# Patient Record
Sex: Male | Born: 1945 | Race: White | Hispanic: No | Marital: Married | Smoking: Former smoker
Health system: Southern US, Community
[De-identification: ages and names within clinical notes are randomized; demographics above are authoritative.]

## PROBLEM LIST (undated history)

## (undated) DIAGNOSIS — Z8719 Personal history of other diseases of the digestive system: Secondary | ICD-10-CM

## (undated) DIAGNOSIS — S42309A Unspecified fracture of shaft of humerus, unspecified arm, initial encounter for closed fracture: Secondary | ICD-10-CM

## (undated) DIAGNOSIS — K219 Gastro-esophageal reflux disease without esophagitis: Secondary | ICD-10-CM

## (undated) DIAGNOSIS — E7800 Pure hypercholesterolemia, unspecified: Secondary | ICD-10-CM

## (undated) DIAGNOSIS — I10 Essential (primary) hypertension: Secondary | ICD-10-CM

## (undated) DIAGNOSIS — K635 Polyp of colon: Secondary | ICD-10-CM

## (undated) DIAGNOSIS — T4145XA Adverse effect of unspecified anesthetic, initial encounter: Secondary | ICD-10-CM

## (undated) DIAGNOSIS — M1990 Unspecified osteoarthritis, unspecified site: Secondary | ICD-10-CM

## (undated) DIAGNOSIS — T8859XA Other complications of anesthesia, initial encounter: Secondary | ICD-10-CM

## (undated) DIAGNOSIS — E669 Obesity, unspecified: Secondary | ICD-10-CM

## (undated) HISTORY — PX: OTHER SURGICAL HISTORY: SHX169

## (undated) HISTORY — DX: Obesity, unspecified: E66.9

## (undated) HISTORY — DX: Unspecified fracture of shaft of humerus, unspecified arm, initial encounter for closed fracture: S42.309A

## (undated) HISTORY — DX: Pure hypercholesterolemia, unspecified: E78.00

## (undated) HISTORY — DX: Polyp of colon: K63.5

## (undated) HISTORY — DX: Essential (primary) hypertension: I10

## (undated) HISTORY — PX: BACK SURGERY: SHX140

---

## 1998-05-28 HISTORY — PX: LUMBAR FUSION: SHX111

## 1999-06-17 ENCOUNTER — Encounter: Payer: Self-pay | Admitting: Family Medicine

## 1999-06-17 ENCOUNTER — Ambulatory Visit (HOSPITAL_COMMUNITY): Admission: RE | Admit: 1999-06-17 | Discharge: 1999-06-17 | Payer: Self-pay | Admitting: Family Medicine

## 1999-07-05 ENCOUNTER — Ambulatory Visit (HOSPITAL_COMMUNITY): Admission: RE | Admit: 1999-07-05 | Discharge: 1999-07-05 | Payer: Self-pay | Admitting: Family Medicine

## 1999-07-05 ENCOUNTER — Encounter: Payer: Self-pay | Admitting: Family Medicine

## 1999-07-06 ENCOUNTER — Encounter: Payer: Self-pay | Admitting: Family Medicine

## 1999-07-06 ENCOUNTER — Ambulatory Visit (HOSPITAL_COMMUNITY): Admission: RE | Admit: 1999-07-06 | Discharge: 1999-07-06 | Payer: Self-pay | Admitting: Family Medicine

## 1999-10-03 ENCOUNTER — Encounter: Payer: Self-pay | Admitting: Gynecology

## 1999-10-03 ENCOUNTER — Ambulatory Visit (HOSPITAL_COMMUNITY): Admission: RE | Admit: 1999-10-03 | Discharge: 1999-10-03 | Payer: Self-pay | Admitting: Gynecology

## 1999-10-03 ENCOUNTER — Encounter: Payer: Self-pay | Admitting: Neurosurgery

## 1999-10-05 ENCOUNTER — Encounter: Payer: Self-pay | Admitting: Neurosurgery

## 1999-10-05 ENCOUNTER — Inpatient Hospital Stay (HOSPITAL_COMMUNITY): Admission: RE | Admit: 1999-10-05 | Discharge: 1999-10-07 | Payer: Self-pay | Admitting: Neurosurgery

## 1999-10-08 ENCOUNTER — Inpatient Hospital Stay (HOSPITAL_COMMUNITY): Admission: AD | Admit: 1999-10-08 | Discharge: 1999-10-10 | Payer: Self-pay | Admitting: Neurosurgery

## 2001-05-13 ENCOUNTER — Emergency Department (HOSPITAL_COMMUNITY): Admission: EM | Admit: 2001-05-13 | Discharge: 2001-05-13 | Payer: Self-pay | Admitting: Emergency Medicine

## 2001-05-14 ENCOUNTER — Encounter: Payer: Self-pay | Admitting: Emergency Medicine

## 2001-05-23 ENCOUNTER — Ambulatory Visit (HOSPITAL_COMMUNITY): Admission: RE | Admit: 2001-05-23 | Discharge: 2001-05-23 | Payer: Self-pay | Admitting: Family Medicine

## 2001-05-23 ENCOUNTER — Encounter: Payer: Self-pay | Admitting: Family Medicine

## 2002-12-22 ENCOUNTER — Encounter: Payer: Self-pay | Admitting: Family Medicine

## 2002-12-22 ENCOUNTER — Ambulatory Visit (HOSPITAL_COMMUNITY): Admission: RE | Admit: 2002-12-22 | Discharge: 2002-12-22 | Payer: Self-pay | Admitting: Family Medicine

## 2003-04-19 ENCOUNTER — Ambulatory Visit (HOSPITAL_BASED_OUTPATIENT_CLINIC_OR_DEPARTMENT_OTHER): Admission: RE | Admit: 2003-04-19 | Discharge: 2003-04-19 | Payer: Self-pay | Admitting: Orthopedic Surgery

## 2003-04-19 ENCOUNTER — Ambulatory Visit (HOSPITAL_COMMUNITY): Admission: RE | Admit: 2003-04-19 | Discharge: 2003-04-19 | Payer: Self-pay | Admitting: Orthopedic Surgery

## 2003-12-21 ENCOUNTER — Ambulatory Visit (HOSPITAL_COMMUNITY): Admission: RE | Admit: 2003-12-21 | Discharge: 2003-12-21 | Payer: Self-pay | Admitting: Family Medicine

## 2004-02-14 ENCOUNTER — Ambulatory Visit (HOSPITAL_BASED_OUTPATIENT_CLINIC_OR_DEPARTMENT_OTHER): Admission: RE | Admit: 2004-02-14 | Discharge: 2004-02-14 | Payer: Self-pay | Admitting: Plastic Surgery

## 2004-02-14 ENCOUNTER — Encounter (INDEPENDENT_AMBULATORY_CARE_PROVIDER_SITE_OTHER): Payer: Self-pay | Admitting: Specialist

## 2004-02-14 ENCOUNTER — Ambulatory Visit (HOSPITAL_COMMUNITY): Admission: RE | Admit: 2004-02-14 | Discharge: 2004-02-14 | Payer: Self-pay | Admitting: Plastic Surgery

## 2004-06-12 ENCOUNTER — Ambulatory Visit: Payer: Self-pay | Admitting: Internal Medicine

## 2004-06-12 ENCOUNTER — Ambulatory Visit (HOSPITAL_BASED_OUTPATIENT_CLINIC_OR_DEPARTMENT_OTHER): Admission: RE | Admit: 2004-06-12 | Discharge: 2004-06-12 | Payer: Self-pay | Admitting: Family Medicine

## 2007-07-17 ENCOUNTER — Ambulatory Visit (HOSPITAL_COMMUNITY): Admission: RE | Admit: 2007-07-17 | Discharge: 2007-07-17 | Payer: Self-pay | Admitting: General Surgery

## 2007-07-29 ENCOUNTER — Encounter: Admission: RE | Admit: 2007-07-29 | Discharge: 2007-07-29 | Payer: Self-pay | Admitting: General Surgery

## 2007-11-10 ENCOUNTER — Encounter: Admission: RE | Admit: 2007-11-10 | Discharge: 2008-02-08 | Payer: Self-pay | Admitting: General Surgery

## 2007-11-25 ENCOUNTER — Ambulatory Visit (HOSPITAL_COMMUNITY): Admission: RE | Admit: 2007-11-25 | Discharge: 2007-11-26 | Payer: Self-pay | Admitting: General Surgery

## 2007-11-25 HISTORY — PX: LAPAROSCOPIC GASTRIC BANDING: SHX1100

## 2008-05-28 HISTORY — PX: TOTAL KNEE ARTHROPLASTY: SHX125

## 2009-02-18 ENCOUNTER — Ambulatory Visit (HOSPITAL_COMMUNITY): Admission: RE | Admit: 2009-02-18 | Discharge: 2009-02-18 | Payer: Self-pay | Admitting: Neurological Surgery

## 2009-06-20 ENCOUNTER — Inpatient Hospital Stay (HOSPITAL_COMMUNITY): Admission: RE | Admit: 2009-06-20 | Discharge: 2009-06-23 | Payer: Self-pay | Admitting: Orthopedic Surgery

## 2009-11-07 ENCOUNTER — Inpatient Hospital Stay (HOSPITAL_COMMUNITY): Admission: RE | Admit: 2009-11-07 | Discharge: 2009-11-09 | Payer: Self-pay | Admitting: Orthopedic Surgery

## 2010-08-13 LAB — BASIC METABOLIC PANEL
BUN: 8 mg/dL (ref 6–23)
CO2: 29 mEq/L (ref 19–32)
CO2: 30 mEq/L (ref 19–32)
CO2: 30 mEq/L (ref 19–32)
Chloride: 100 mEq/L (ref 96–112)
Chloride: 104 mEq/L (ref 96–112)
Chloride: 104 mEq/L (ref 96–112)
Chloride: 103 mEq/L (ref 96–112)
Creatinine, Ser: 0.67 mg/dL (ref 0.4–1.5)
GFR calc Af Amer: >60 mL/min (ref >60)
GFR calc Af Amer: >60 mL/min (ref >60)
GFR calc non Af Amer: >60 mL/min (ref >60)
Glucose, Bld: 146 mg/dL — ABNORMAL HIGH (ref 70–99)
Glucose, Bld: 149 mg/dL — ABNORMAL HIGH (ref 70–99)
Glucose, Bld: 116 mg/dL — ABNORMAL HIGH (ref 70–99)
Potassium: 3.7 mEq/L (ref 3.5–5.1)
Potassium: 3.9 mEq/L (ref 3.5–5.1)
Potassium: 3.7 mEq/L (ref 3.5–5.1)
Sodium: 139 mEq/L (ref 135–145)

## 2010-08-13 LAB — CBC
HCT: 26.9 % — ABNORMAL LOW (ref 39.0–52.0)
HCT: 30.9 % — ABNORMAL LOW (ref 39.0–52.0)
HCT: 27 % — ABNORMAL LOW (ref 39.0–52.0)
Hemoglobin: 10.5 g/dL — ABNORMAL LOW (ref 13.0–17.0)
Hemoglobin: 13.9 g/dL (ref 13.0–17.0)
Hemoglobin: 9.1 g/dL — ABNORMAL LOW (ref 13.0–17.0)
MCHC: 33.7 g/dL (ref 30.0–36.0)
MCHC: 33.9 g/dL (ref 30.0–36.0)
MCHC: 34.6 g/dL (ref 30.0–36.0)
MCHC: 34.8 g/dL (ref 30.0–36.0)
MCV: 92.4 fL (ref 78.0–100.0)
MCV: 92.4 fL (ref 78.0–100.0)
MCV: 92.5 fL (ref 78.0–100.0)
MCV: 87.6 fL (ref 78.0–100.0)
Platelets: 228 10*3/uL (ref 150–400)
Platelets: 172 10*3/uL (ref 150–400)
RBC: 2.57 MIL/uL — ABNORMAL LOW (ref 4.22–5.81)
RBC: 3.34 MIL/uL — ABNORMAL LOW (ref 4.22–5.81)
RDW: 13.3 % (ref 11.5–15.5)
RDW: 13.3 % (ref 11.5–15.5)
RDW: 13.4 % (ref 11.5–15.5)
RDW: 16.2 % — ABNORMAL HIGH (ref 11.5–15.5)
WBC: 11.2 10*3/uL — ABNORMAL HIGH (ref 4.0–10.5)

## 2010-08-13 LAB — COMPREHENSIVE METABOLIC PANEL
ALT: 31 U/L (ref 0–53)
AST: 30 U/L (ref 0–37)
Albumin: 4.6 g/dL (ref 3.5–5.2)
Alkaline Phosphatase: 53 U/L (ref 39–117)
Calcium: 10.1 mg/dL (ref 8.4–10.5)
GFR calc Af Amer: >60 mL/min (ref >60)
Potassium: 4.2 mEq/L (ref 3.5–5.1)
Sodium: 138 mEq/L (ref 135–145)
Total Protein: 7.2 g/dL (ref 6.0–8.3)

## 2010-08-13 LAB — APTT: aPTT: 27 seconds (ref 24–37)

## 2010-08-13 LAB — DIFFERENTIAL
Basophils Relative: 1 % (ref 0–1)
Eosinophils Absolute: 0.2 10*3/uL (ref 0.0–0.7)
Lymphs Abs: 2.1 10*3/uL (ref 0.7–4.0)
Monocytes Absolute: 0.7 10*3/uL (ref 0.1–1.0)
Monocytes Relative: 9 % (ref 3–12)
Neutrophils Relative %: 61 % (ref 43–77)

## 2010-08-13 LAB — URINE CULTURE: Colony Count: NO GROWTH

## 2010-08-13 LAB — URINALYSIS, ROUTINE W REFLEX MICROSCOPIC
Bilirubin Urine: NEGATIVE
Hgb urine dipstick: NEGATIVE
Nitrite: NEGATIVE
Specific Gravity, Urine: 1.023 (ref 1.005–1.030)
Urobilinogen, UA: 1 mg/dL (ref 0.0–1.0)
pH: 6.5 (ref 5.0–8.0)

## 2010-08-13 LAB — ABO/RH: ABO/RH(D): O POS

## 2010-08-13 LAB — PROTIME-INR
INR: 1.11 (ref 0.00–1.49)
Prothrombin Time: 29.7 seconds — ABNORMAL HIGH (ref 11.6–15.2)

## 2010-08-14 LAB — URINE CULTURE
Colony Count: NO GROWTH
Culture: NO GROWTH

## 2010-08-14 LAB — COMPREHENSIVE METABOLIC PANEL
ALT: 21 U/L (ref 0–53)
Albumin: 4.1 g/dL (ref 3.5–5.2)
Alkaline Phosphatase: 77 U/L (ref 39–117)
GFR calc Af Amer: >60 mL/min (ref >60)
Potassium: 4.2 mEq/L (ref 3.5–5.1)
Sodium: 140 mEq/L (ref 135–145)
Total Protein: 6.9 g/dL (ref 6.0–8.3)

## 2010-08-14 LAB — BASIC METABOLIC PANEL
BUN: 10 mg/dL (ref 6–23)
CO2: 32 mEq/L (ref 19–32)
Calcium: 9 mg/dL (ref 8.4–10.5)
Chloride: 104 mEq/L (ref 96–112)
Creatinine, Ser: 0.66 mg/dL (ref 0.4–1.5)
Glucose, Bld: 115 mg/dL — ABNORMAL HIGH (ref 70–99)

## 2010-08-14 LAB — URINALYSIS, ROUTINE W REFLEX MICROSCOPIC
Glucose, UA: NEGATIVE mg/dL
Hgb urine dipstick: NEGATIVE
Specific Gravity, Urine: 1.027 (ref 1.005–1.030)
Urobilinogen, UA: 0.2 mg/dL (ref 0.0–1.0)
pH: 5 (ref 5.0–8.0)

## 2010-08-14 LAB — CBC
MCHC: 33.9 g/dL (ref 30.0–36.0)
MCV: 87.3 fL (ref 78.0–100.0)
Platelets: 270 10*3/uL (ref 150–400)
RBC: 3.34 MIL/uL — ABNORMAL LOW (ref 4.22–5.81)
RDW: 16.4 % — ABNORMAL HIGH (ref 11.5–15.5)
RDW: 16.4 % — ABNORMAL HIGH (ref 11.5–15.5)

## 2010-08-14 LAB — DIFFERENTIAL
Basophils Relative: 1 % (ref 0–1)
Eosinophils Absolute: 0.3 10*3/uL (ref 0.0–0.7)
Monocytes Absolute: 0.6 10*3/uL (ref 0.1–1.0)
Monocytes Relative: 8 % (ref 3–12)
Neutro Abs: 5.1 10*3/uL (ref 1.7–7.7)

## 2010-08-14 LAB — TYPE AND SCREEN: ABO/RH(D): O POS

## 2010-08-14 LAB — APTT: aPTT: 28 seconds (ref 24–37)

## 2010-09-01 LAB — BASIC METABOLIC PANEL
CO2: 30 mEq/L (ref 19–32)
Calcium: 9.4 mg/dL (ref 8.4–10.5)
Creatinine, Ser: 0.56 mg/dL (ref 0.4–1.5)
GFR calc Af Amer: >60 mL/min (ref >60)
GFR calc non Af Amer: >60 mL/min (ref >60)
Glucose, Bld: 102 mg/dL — ABNORMAL HIGH (ref 70–99)
Sodium: 138 mEq/L (ref 135–145)

## 2010-09-01 LAB — DIFFERENTIAL
Basophils Absolute: 0 10*3/uL (ref 0.0–0.1)
Basophils Relative: 0 % (ref 0–1)
Lymphocytes Relative: 17 % (ref 12–46)
Neutro Abs: 7.8 10*3/uL — ABNORMAL HIGH (ref 1.7–7.7)
Neutrophils Relative %: 73 % (ref 43–77)

## 2010-09-01 LAB — APTT: aPTT: 26 seconds (ref 24–37)

## 2010-09-01 LAB — CBC
Hemoglobin: 12.7 g/dL — ABNORMAL LOW (ref 13.0–17.0)
MCHC: 34 g/dL (ref 30.0–36.0)
RBC: 4.02 MIL/uL — ABNORMAL LOW (ref 4.22–5.81)
RDW: 14.1 % (ref 11.5–15.5)

## 2010-09-01 LAB — PROTIME-INR: INR: 1.1 (ref 0.00–1.49)

## 2010-10-10 NOTE — Op Note (Signed)
Philip [name], Philip [name]              ACCOUNT NO.:  [id number]   MEDICAL RECORD NO.:  [id number]          PATIENT TYPE:  OIB   LOCATION:  0098                         FACILITY:  [facility]   PHYSICIAN:  Sharlet Salina T. Hoxworth, M.D.DATE OF BIRTH:  Nov 03, 1945   DATE OF PROCEDURE:  11/25/2007  DATE OF DISCHARGE:                               OPERATIVE REPORT   PREOPERATIVE DIAGNOSIS:  Morbid obesity.   POSTOPERATIVE DIAGNOSIS:  Morbid obesity.   SURGICAL PROCEDURE:  Placement of laparoscopic adjustable gastric band.   SURGEON:  Lorne Skeens. Hoxworth, M.D.   ASSISTANT:  Thornton Park. Daphine Deutscher, MD   ANESTHESIA:  General.   BRIEF HISTORY:  Mr. Dobratz is a 65 year old male with longstanding  morbid obesity unresponsive to medical management with comorbidities of  degenerative disease, obstructive sleep apnea, hypertension, and  elevated cholesterol.  After extensive preoperative workup and  discussion detailed elsewhere, we have elected to proceed with the  placement laparoscopic adjustable gastric band for his obesity.  He was  brought to operating room for this procedure.   DESCRIPTION OF OPERATION:  The patient brought to operating room and  placed in the supine position on the operating table, and general  orotracheal anesthesia was induced.  The abdomen was widely sterilely  prepped and draped.  PAS were placed.  He received IV antibiotics and  subcutaneous heparin preoperatively.  Correct patient and procedure were  verified.  Local anesthesia was used to infiltrate the trocar sites.  Access was obtained in the left subcostal space with an 11-mm OptiVu  trocar without difficulty and pneumoperitoneum established.   Under direct vision a 15-mm trocar was placed laterally in the right  upper abdomen and 11-mm trocar in the right upper abdomen midclavicular  area, and another 11-mm trocar to the left of the midline and upper  abdomen for the camera port.  Through a 5-mm subxiphoid site, a  Nathanson's retractor was placed and the left lobe of liver elevated  with exposure of the upper stomach, gastrohepatic omentum, and hiatus.  The peritoneum overlying the left crus was incised with cautery leaving  some lateral attachments to the fundus, and careful blunt dissection was  carried back down along the left crus toward the retrogastric space.   The gastrohepatic omentum was then opened in an avascular area, and the  base of the right crus exposed.  At the area of crossing fat the  peritoneum was incised, and using the finger dissector with careful  blunt dissection the tip was passed retrogastric and then deployed back  up through the previously dissected area near the angle of His without  difficulty.  An 80 large flushed band system was introduced, the tubing  passed into the finger dissector which was then brought back up behind  the stomach, and the band brought back around behind the stomach without  difficulty.  With the sizing tube in place, the band was buckled without  undue tension, and the sizing tube removed.  Holding the band tubing  towards the patient's feet, the fundus of the stomach was imbricated up  over the band to the small  gastric pouch with 3 interrupted 2-0 Ethibond  sutures.  The sizing tube was removed without difficulty.  The band  appeared to be in good position.   The abdomen was inspected for hemostasis which appeared complete.  The  tubing was brought out through the right mid abdominal trocar site.  The  Nathanson's retractor was removed under direct vision.  All CO2  evacuated and trocars removed.  The tubing was cut and attached to the  port, and the port was sutured to the anterior fascia at the right mid  abdominal incision after lengthening it slightly with interrupted 2-0  Prolene sutures.  The tubing was seen to curve smoothly into the  abdomen.  The subcu at the site was then closed with running 2-0 Vicryl,  and skin incisions were  closed with subcuticular Monocryl and Dermabond.  Sponge and needle counts were correct.  The patient taken to recovery in  good condition.      Lorne Skeens. Hoxworth, M.D.  Electronically Signed     BTH/MEDQ  D:  11/25/2007  T:  11/25/2007  Job:  [id number]

## 2010-10-13 NOTE — Procedures (Signed)
NAMEANDRES, Philip [name]              ACCOUNT NO.:  [id number]   MEDICAL RECORD NO.:  [id number]          PATIENT TYPE:  OUT   LOCATION:  SLEEP CENTER                 FACILITY:  [facility]   PHYSICIAN:  Clinton D. Maple Hudson, M.D. DATE OF BIRTH:  01/09/1946   DATE OF STUDY:                              NOCTURNAL POLYSOMNOGRAM   REFERRING PHYSICIAN:  Dr. Nolene Ebbs, IV   INDICATION FOR STUDY:  Hypersomnia with sleep apnea.  Epworth sleepiness  score 12/24.  BMI 47.3.  Weight 320 pounds.   SLEEP ARCHITECTURE:  Total sleep time 398 minutes with sleep efficiency 91%.  Stage 1 was 9%, stage 2 48%, stages 3 and 4 17%, and REM was 26% of total  sleep time.  Sleep latency 16 minutes, REM latency 167 minutes, awake after  sleep onset 20 minutes, arousal index 33.   RESPIRATORY DATA:  Split study protocol.  RDI 80.7 per hour indicating very  severe obstructive sleep apnea/hypopnea syndrome before CPAP.  This included  36 obstructive apneas and 159 hypopneas before CPAP.  Almost all sleep and  therefore almost all the events were while supine.  REM RDI 16.  CPAP was  titrated to 12 CWP, RDI 3.6 per hour using a ResMed Mirage with medium nasal  pillows and a heated humidifier.   OXYGEN DATA:  Very loud snoring with oxygen desaturation to a nadir of 79%  before CPAP.  After CPAP control, oxygen saturation held 95-99%.   CARDIAC DATA:  Normal sinus rhythm.   MOVEMENT/PARASOMNIA:  A total of 260 limb jerks were recorded, of which 36  were associated with arousal or awakening for a periodic limb movement with  arousal index of 5.4 per hour which is increased.   IMPRESSION/RECOMMENDATION:  Severe obstructive sleep apnea/hypopnea  syndrome, respiratory disturbance index 80.7 per hour with oxygen  desaturation to 79%.  Successful CPAP titration to 12 CWP, respiratory  disturbance index 3.6 per hour using a ResMed Mirage with medium nasal  pillows and heated humidifier.  Periodic limb movement with  arousal, 5.4 per  hour.      CDY/MEDQ  D:  06/18/2004 11:11:04  T:  06/18/2004 15:16:06  Job:  [id number]

## 2010-10-13 NOTE — Discharge Summary (Signed)
[place]. [facility]  Patient:    YING, ROCKS                     MRN: [id number] Adm. Date:  [id number] Disc. Date: 10/10/99 Attending:  Jackelyn Knife                           Discharge Summary  HISTORY OF PRESENT ILLNESS:  The patient is a 65 year old man who had surgery on his back last week for spinal stenosis at L2-3 and for foraminal stenosis on the left at L5-S1.  He is generally fairly healthy, except for his size, which is 303 pounds.  He was discharged two days after his initial surgery and was home only overnight, but had to be readmitted because of drainage from his wound, as well as severe pain and muscle spasm.  HOSPITAL COURSE:  He was readmitted on Oct 08, 1999, and cultures were taken of the small amount of drainage at that particular time coming from his incision.  He was placed on IV antibiotics and muscle relaxers and analgesics and this helped his overall situation rather dramatically.  He was recultured and then on Oct 09, 1999, started on Cipro in anticipation of discharge today.  At the time of discharge, Oct 10, 1999, his drainage had become less, although it was still present.  Cultures were not revealing at the time of discharge. He was much more comfortable and was able to get about and move with greater ease and wanted to go home.  It was elected to discharge him and follow his incision status closely as an outpatient.  FINAL DIAGNOSES: 1. Status post surgery for spinal stenosis at L2-3 and foraminal stenosis,    left L5-S1. 2. Drainage from his wound, which is probably necrotic subcutaneous fatty    tissue. 3. Concern over potential wound infection.  CONDITION ON DISCHARGE:  Much improved.  He still had some drainage from his incision as noted, but it was much less and he was much more comfortable.  ACTIVITIES:  Up ad lib.  He is to walk several times a day for 15 minutes a session.  DIET:  A 1500 calorie  reducing diet.  DISCHARGE MEDICATIONS:  He has Percocet at home and I want him on Cipro 750 mg p.o. b.i.d.  FOLLOW-UP:  He is to come back and see me in the office in three days for check of his incision.  WOUND CARE:  He has instructions regarding wound care. DD:  10/10/99 TD:  10/10/99 Job: [id number]

## 2010-10-13 NOTE — H&P (Signed)
[place]. [facility]  Patient:    Philip [name], Philip [name]                     MRN: [id number] Adm. Date:  [id number] Disc. Date: [id number] Attending:  Jackelyn Knife                         History and Physical  HISTORY:  Philip [name] is a 65 year old man who was discharged one day ago after having had a bilateral decompression at L2-3 for spinal stenosis and also a left foraminotomy at L5-S1.  He was discharged home and remained in considerable pain at home.  His medications were adjusted and progress seem to be made in that regard, but a phone call this morning from his wife revealed that he was again quite uncomfortable and had what sounded like major drainage from his wound.  A decision was made at that point o go ahead and readmit him for evaluation of this drainage and also as a matter of comfort to be able to get him back on IV analgesics and muscle relaxants.  It should be noted that this man weighs 303.0 pounds.  Past medical history, family history, review of systems are unchanged from three days ago.  PHYSICAL EXAMINATION: On examination, he is alert and cooperative.  HEENT:  Normal.  CHEST:  Clear.  CARDIAC:  Negative.  ABDOMEN:  Obese, but soft and nontender.  BACK:  Examination of his back reveals drainage from the upper of the two incisions with a good deal of soft tissue bruising over the lumbosacral area generally. he dressing had just been changed and there was only a small amount of drainage, barely enough to culture, but this was done for culture and sensitivity.  The wound was redressed.  NEUROLOGIC:  His motor examination showed good strength, including dorsiflexor strength on the left, which had been a problem.  IMPRESSION: 1. Severe muscle spasm postoperatively requiring readmission for analgesia. 2. Drainage from his wound, which is probably fat necrosis given his size.  We ill    have to get him on IV  antibiotics.  PLAN:  Our plan is to bring him in for IV analgesics and IV antibiotics and a culture, which has already been done. DD:  10/08/99 TD:  10/08/99 Job: [id number]

## 2011-02-22 LAB — BASIC METABOLIC PANEL
BUN: 17
Calcium: 10
Creatinine, Ser: 0.68
GFR calc Af Amer: >60
GFR calc non Af Amer: >60

## 2011-02-22 LAB — CBC
MCHC: 34
Platelets: 252
RDW: 13.9

## 2011-02-22 LAB — DIFFERENTIAL
Basophils Absolute: 0
Basophils Relative: 0
Monocytes Absolute: 1.1 — ABNORMAL HIGH
Neutro Abs: 7.4
Neutrophils Relative %: 68

## 2011-02-22 LAB — HEMOGLOBIN AND HEMATOCRIT, BLOOD
HCT: 40.1
Hemoglobin: 13.7

## 2011-07-16 ENCOUNTER — Encounter: Payer: Self-pay | Admitting: Gastroenterology

## 2011-07-27 ENCOUNTER — Ambulatory Visit (AMBULATORY_SURGERY_CENTER): Payer: Medicare Other

## 2011-07-27 VITALS — Ht 68.0 in | Wt 268.0 lb

## 2011-07-27 DIAGNOSIS — Z1211 Encounter for screening for malignant neoplasm of colon: Secondary | ICD-10-CM

## 2011-07-27 MED ORDER — PEG-KCL-NACL-NASULF-NA ASC-C 100 G PO SOLR: 1.0000 | Freq: Once | ORAL | Status: DC

## 2011-08-10 ENCOUNTER — Ambulatory Visit (AMBULATORY_SURGERY_CENTER): Payer: Medicare Other | Admitting: Gastroenterology

## 2011-08-10 ENCOUNTER — Encounter: Payer: Self-pay | Admitting: Gastroenterology

## 2011-08-10 VITALS — BP 137/83 | HR 78 | Temp 97.8°F | Resp 20 | Ht 68.0 in | Wt 268.0 lb

## 2011-08-10 DIAGNOSIS — Z86010 Personal history of colonic polyps: Secondary | ICD-10-CM

## 2011-08-10 DIAGNOSIS — Z1211 Encounter for screening for malignant neoplasm of colon: Secondary | ICD-10-CM

## 2011-08-10 MED ORDER — SODIUM CHLORIDE 0.9 % IV SOLN: 500.0000 mL | INTRAVENOUS | Status: DC

## 2011-08-10 NOTE — Progress Notes (Signed)
Patient did not experience any of the following events: a burn prior to discharge; a fall within the facility; wrong site/side/patient/procedure/implant event; or a hospital transfer or hospital admission upon discharge from the facility. (G8907) Patient did not have preoperative order for IV antibiotic SSI prophylaxis. (G8918)  

## 2011-08-10 NOTE — Op Note (Signed)
[facility] [address] [place], [place]  [address]  COLONOSCOPY PROCEDURE REPORT  PATIENT:  [name], Philip  MR#:  [id number] BIRTHDATE:  1946/04/01, 65 yrs. old  GENDER:  male ENDOSCOPIST:  Vania Rea. Jarold Motto, MD, [facility] REF. BY: PROCEDURE DATE:  08/10/2011 PROCEDURE:  Surveillance Colonoscopy ASA CLASS:  Class II INDICATIONS:  history of pre-cancerous (adenomatous) colon polyps  MEDICATIONS:   propofol (Diprivan) 200 mg IV  DESCRIPTION OF PROCEDURE:   After the risks and benefits and of the procedure were explained, informed consent was obtained. Digital rectal exam was performed and revealed no abnormalities. The LB CF-H180AL [id number] endoscope was introduced through the anus and advanced to the cecum, which was identified by both the appendix and ileocecal valve.  The quality of the prep was excellent, using MoviPrep.  The instrument was then slowly withdrawn as the colon was fully examined. <<PROCEDUREIMAGES>>  FINDINGS:  No polyps or cancers were seen.  This was otherwise a normal examination of the colon.   Retroflexed views in the rectum revealed no abnormalities.    The scope was then withdrawn from the patient and the procedure completed.  COMPLICATIONS:  None ENDOSCOPIC IMPRESSION: 1) No polyps or cancers 2) Otherwise normal examination RECOMMENDATIONS: 1) Continue current colorectal screening recommendations for "routine risk" patients with a repeat colonoscopy in 10 years.  REPEAT EXAM:  No  ______________________________ Vania Rea. Jarold Motto, MD, [facility]  CC:  Johny Blamer MD  n. Rosalie Doctor:   Vania Rea. Davarious Tumbleson at 08/10/2011 01:56 PM  Latina Craver, [phone]

## 2011-08-10 NOTE — Patient Instructions (Signed)

## 2011-08-13 ENCOUNTER — Telehealth: Payer: Self-pay | Admitting: *Deleted

## 2011-08-13 NOTE — Telephone Encounter (Signed)
  Follow up Call-  Call back number 08/10/2011  Post procedure Call Back phone  # [phone]  Permission to leave phone message Yes     Patient questions:  Do you have a fever, pain , or abdominal swelling? no Pain Score  0 *  Have you tolerated food without any problems? yes  Have you been able to return to your normal activities? yes  Do you have any questions about your discharge instructions: Diet   no Medications  no Follow up visit  no  Do you have questions or concerns about your Care? no  Actions: * If pain score is 4 or above: No action needed, pain <4.

## 2011-09-20 ENCOUNTER — Encounter (INDEPENDENT_AMBULATORY_CARE_PROVIDER_SITE_OTHER): Payer: Self-pay

## 2011-09-20 ENCOUNTER — Ambulatory Visit (INDEPENDENT_AMBULATORY_CARE_PROVIDER_SITE_OTHER): Payer: Medicare Other | Admitting: Physician Assistant

## 2011-09-20 VITALS — BP 160/80 | Ht 68.0 in | Wt 272.8 lb

## 2011-09-20 DIAGNOSIS — Z4651 Encounter for fitting and adjustment of gastric lap band: Secondary | ICD-10-CM

## 2011-09-20 NOTE — Patient Instructions (Signed)
Take clear liquids tonight. Thin protein shakes are ok to start tomorrow morning. Slowly advance your diet thereafter. Call us if you have persistent vomiting or regurgitation, night cough or reflux symptoms. Return as scheduled or sooner if you notice no changes in hunger/portion sizes.  

## 2011-09-20 NOTE — Progress Notes (Signed)
  HISTORY: Philip [name] is a 66 y.o.male who received an ap-large lap-band in June 2009 by Dr. Johna Sheriff. He was last seen 14 months ago and unfortunately he has gained 22 lbs. He is now 70lbs under his pre op weight. He has no untoward symptoms but is frustrated with being able to eat too much and also some poor food choices.  We talked extensively about these issues. It's pretty clear that he needs an adjustment but he needs to address the food choice issue as well.  VITAL SIGNS: Filed Vitals:   09/20/11 1058  BP: 160/80    PHYSICAL EXAM: Physical exam reveals a very well-appearing 65 y.o.male in no apparent distress Neurologic: Awake, alert, oriented Psych: Bright affect, conversant Respiratory: Breathing even and unlabored. No stridor or wheezing Abdomen: Soft, nontender, nondistended to palpation. Incisions well-healed. No incisional hernias. Port easily palpated. Extremities: Atraumatic, good range of motion.  ASSESMENT: 66 y.o.  male  s/p AP-Large lap-band.   PLAN: The patient's port was accessed with a 20G Huber needle without difficulty. Clear fluid was aspirated and 0.5 mL saline was added to the port to give a total predicted volume of 13 mL. The patient was able to swallow water without difficulty following the procedure and was instructed to take clear liquids for the next 24-48 hours and advance slowly as tolerated.

## 2011-10-25 ENCOUNTER — Encounter (INDEPENDENT_AMBULATORY_CARE_PROVIDER_SITE_OTHER): Payer: Self-pay

## 2011-10-25 ENCOUNTER — Ambulatory Visit (INDEPENDENT_AMBULATORY_CARE_PROVIDER_SITE_OTHER): Payer: Medicare Other | Admitting: Physician Assistant

## 2011-10-25 NOTE — Patient Instructions (Signed)
Return in two months or sooner if needed. 

## 2011-10-25 NOTE — Progress Notes (Signed)
  HISTORY: Philip [name] is a 66 y.o.male who received an AP-Large lap-band in June 2009 by Dr. Johna Sheriff. He comes in with no persistent complaints of hunger. His portion sizes remain small. He says that he's caught himself boredom eating, particularly when he's in the house. He's not certain that he needs an adjustment today.  VITAL SIGNS: Filed Vitals:   10/25/11 0949  BP: 130/78  Pulse: 72  Temp: 98.2 F (36.8 C)  Resp: 12    PHYSICAL EXAM: Physical exam reveals a very well-appearing 65 y.o.male in no apparent distress Neurologic: Awake, alert, oriented Psych: Bright affect, conversant Respiratory: Breathing even and unlabored. No stridor or wheezing Extremities: Atraumatic, good range of motion. Skin: Warm, Dry, no rashes Musculoskeletal: Normal gait, Joints normal  ASSESMENT: 66 y.o.  male  s/p AP-Large lap-band.   PLAN: We deferred an adjustment today as he appears to be in the green zone. We discussed indications for upward adjustment and he voiced understanding. We'll have him back in two months or sooner if needed.

## 2012-01-10 ENCOUNTER — Ambulatory Visit (INDEPENDENT_AMBULATORY_CARE_PROVIDER_SITE_OTHER): Payer: Medicare Other | Admitting: Physician Assistant

## 2012-01-10 ENCOUNTER — Encounter (INDEPENDENT_AMBULATORY_CARE_PROVIDER_SITE_OTHER): Payer: Self-pay

## 2012-01-10 NOTE — Patient Instructions (Signed)
Return in three months or sooner if needed, especially if you have difficulty swallowing, persistent regurgitation, nighttime cough or reflux, increasing hunger, larger than expected portion sizes or weight gain.

## 2012-01-10 NOTE — Progress Notes (Signed)
  HISTORY: Philip [name] is a 66 y.o.male who received an AP-Large lap-band in June 2009 by Dr. Johna Sheriff. He was last seen in late May and has gained about 2 lbs. We had an extended discussion of his eating habits. On an average day, he rises at around 9 am, first meal is instant oatmeal/grits. Lunch comes at about 1 pm with a small meal. Dinner is usually at 7 or so. He doesn't snack between meals. One-two hours after dinner, however, he has the intense desire to snack. He says he usually is watching television at this point. 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 He wants the fix for weight loss but knows that an adjustment wouldn't do him much good as he's taking 45 minutes to finish a meal that satisfies him.  VITAL SIGNS: Filed Vitals:   01/10/12 0926  BP: 170/88  Pulse: 84  Resp: 18    PHYSICAL EXAM: Physical exam reveals a very well-appearing 65 y.o.male in no apparent distress Neurologic: Awake, alert, oriented Psych: Bright affect, conversant Respiratory: Breathing even and unlabored. No stridor or wheezing Extremities: Atraumatic, good range of motion. Skin: Warm, Dry, no rashes Musculoskeletal: Normal gait, Joints normal  ASSESMENT: 66 y.o.  male  s/p AP-Large lap-band.   PLAN: We talked at length about boredom eating and lifestyle modification as a means to reduce his snacking. I'm confident that this will help him lose weight. A fill today will most likely give him maladaptive eating and therefore weight gain. We'll have him back in three months to monitor his progress.

## 2012-04-10 ENCOUNTER — Encounter (INDEPENDENT_AMBULATORY_CARE_PROVIDER_SITE_OTHER): Payer: Medicare Other

## 2012-05-08 ENCOUNTER — Encounter (INDEPENDENT_AMBULATORY_CARE_PROVIDER_SITE_OTHER): Payer: Medicare Other

## 2013-01-15 ENCOUNTER — Encounter (INDEPENDENT_AMBULATORY_CARE_PROVIDER_SITE_OTHER): Payer: Self-pay

## 2013-01-15 ENCOUNTER — Ambulatory Visit (INDEPENDENT_AMBULATORY_CARE_PROVIDER_SITE_OTHER): Payer: Medicare Other | Admitting: Physician Assistant

## 2013-01-15 VITALS — BP 140/89 | HR 87 | Temp 98.1°F | Resp 14 | Ht 68.0 in | Wt 268.6 lb

## 2013-01-15 DIAGNOSIS — Z4651 Encounter for fitting and adjustment of gastric lap band: Secondary | ICD-10-CM

## 2013-01-15 NOTE — Patient Instructions (Signed)
Return in three months. Focus on good food choices as well as physical activity. Return sooner if you have an increase in hunger, portion sizes or weight. Return also for difficulty swallowing, night cough, reflux.   

## 2013-01-15 NOTE — Progress Notes (Signed)
  HISTORY: Philip [name] is a 67 y.o.male who received an AP-Large lap-band in June 2009 by Dr. Johna Sheriff. He comes in with 1 lb weight loss and complaints of solid food intolerance. He has problems with steak and pork chops as well as sandwiches and hamburgers. It sounds like bread is something he tries to eat with little success, as expected. He does have significant hunger about 2 hours after eating a small dinner. He would like some fluid removed so he can have a more substantial healthy dinner.  VITAL SIGNS: Filed Vitals:   01/15/13 1151  BP: 140/89  Pulse: 87  Temp: 98.1 F (36.7 C)  Resp: 14    PHYSICAL EXAM: Physical exam reveals a very well-appearing 66 y.o.male in no apparent distress Neurologic: Awake, alert, oriented Psych: Bright affect, conversant Respiratory: Breathing even and unlabored. No stridor or wheezing Abdomen: Soft, nontender, nondistended to palpation. Incisions well-healed. No incisional hernias. Port easily palpated. Extremities: Atraumatic, good range of motion.  ASSESMENT: 67 y.o.  male  s/p AP-Large lap-band.   PLAN: The patient's port was accessed with a 20G Huber needle without difficulty. Clear fluid was aspirated and 0.5 mL saline was removed from the port to give a total predicted volume of 12.5 mL. The patient was advised to concentrate on healthy food choices and to avoid slider foods high in fats and carbohydrates. We talked extensively about maladaptive eating and choosing high protein foods that have staying power. Avoiding carbohydrates will be key. He voiced understanding and agreement.

## 2013-02-27 ENCOUNTER — Other Ambulatory Visit: Payer: Self-pay | Admitting: Neurological Surgery

## 2013-03-27 ENCOUNTER — Encounter (HOSPITAL_COMMUNITY): Payer: Self-pay

## 2013-03-30 ENCOUNTER — Other Ambulatory Visit (HOSPITAL_COMMUNITY): Payer: Self-pay | Admitting: *Deleted

## 2013-03-30 NOTE — Pre-Procedure Instructions (Signed)
Philip [name]  03/30/2013   Your procedure is scheduled on:  Wednesday, April 08, 2013 at 09:30 AM.   Report to [facility] Entrance "A" at 7:30 AM.   Call this number if you have problems the morning of surgery: [phone]   Remember:   Do not eat food or drink liquids after midnight Tuesday, 04/07/13.   Take these medicines the morning of surgery with A SIP OF WATER: doxazosin (CARDURA), oxyCODONE-acetaminophen (PERCOCET/ROXICET) -if needed.  Stop Aspirin as of Wednesday, 04/01/13.    Do not wear jewelry.  Do not wear lotions, powders, or cologne. You may wear deodorant.             Men may shave face and neck.  Do not bring valuables to the hospital.  [facility] is not responsible for any belongings or valuables.               Contacts, dentures or bridgework may not be worn into surgery.  Leave suitcase in the car. After surgery it may be brought to your room.  For patients admitted to the hospital, discharge time is determined by your  treatment team.               Special Instructions: Shower using CHG 2 nights before surgery and the night before surgery.  If you shower the day of surgery use CHG.  Use special wash - you have one bottle of CHG for all showers.  You should use approximately 1/3 of the bottle for each shower.   Please read over the following fact sheets that you were given: Pain Booklet, Coughing and Deep Breathing, Blood Transfusion Information, MRSA Information and Surgical Site Infection Prevention

## 2013-03-31 ENCOUNTER — Encounter (HOSPITAL_COMMUNITY)
Admission: RE | Admit: 2013-03-31 | Discharge: 2013-03-31 | Disposition: A | Discharge status: Home / Self Care | Payer: Medicare Other | Source: Ambulatory Visit | Attending: Neurological Surgery | Admitting: Neurological Surgery

## 2013-03-31 ENCOUNTER — Encounter (HOSPITAL_COMMUNITY): Payer: Self-pay

## 2013-03-31 DIAGNOSIS — Z01812 Encounter for preprocedural laboratory examination: Secondary | ICD-10-CM | POA: Insufficient documentation

## 2013-03-31 DIAGNOSIS — Z01810 Encounter for preprocedural cardiovascular examination: Secondary | ICD-10-CM | POA: Insufficient documentation

## 2013-03-31 DIAGNOSIS — Z01818 Encounter for other preprocedural examination: Secondary | ICD-10-CM | POA: Insufficient documentation

## 2013-03-31 HISTORY — DX: Unspecified osteoarthritis, unspecified site: M19.90

## 2013-03-31 LAB — CBC WITH DIFFERENTIAL/PLATELET
Basophils Absolute: 0.1 10*3/uL (ref 0.0–0.1)
Basophils Relative: 1 % (ref 0–1)
Eosinophils Absolute: 0.3 10*3/uL (ref 0.0–0.7)
Eosinophils Relative: 4 % (ref 0–5)
Lymphocytes Relative: 26 % (ref 12–46)
Lymphs Abs: 2.1 10*3/uL (ref 0.7–4.0)
MCH: 28.1 pg (ref 26.0–34.0)
Monocytes Absolute: 0.8 10*3/uL (ref 0.1–1.0)
Neutrophils Relative %: 60 % (ref 43–77)
Platelets: 233 10*3/uL (ref 150–400)
RBC: 4.06 MIL/uL — ABNORMAL LOW (ref 4.22–5.81)
RDW: 15.5 % (ref 11.5–15.5)
WBC: 8.2 10*3/uL (ref 4.0–10.5)

## 2013-03-31 LAB — TYPE AND SCREEN
ABO/RH(D): O POS
Antibody Screen: NEGATIVE

## 2013-03-31 LAB — BASIC METABOLIC PANEL
Calcium: 9.7 mg/dL (ref 8.4–10.5)
Creatinine, Ser: 0.59 mg/dL (ref 0.50–1.35)
GFR calc non Af Amer: >90 mL/min (ref >90)
Glucose, Bld: 102 mg/dL — ABNORMAL HIGH (ref 70–99)
Sodium: 141 mEq/L (ref 135–145)

## 2013-03-31 LAB — PROTIME-INR
INR: 1.07 (ref 0.00–1.49)
Prothrombin Time: 13.7 seconds (ref 11.6–15.2)

## 2013-03-31 LAB — SURGICAL PCR SCREEN
MRSA, PCR: NEGATIVE
Staphylococcus aureus: NEGATIVE

## 2013-03-31 NOTE — Progress Notes (Signed)
Made Erie Noe aware that patient stated that his surgery was scheduled for an earlier time (0830 am) but now is scheduled for 1130 AM. Surgery now scheduled for 0930 AM.

## 2013-04-07 MED ORDER — DEXTROSE 5 % IV SOLN
3.0000 g | INTRAVENOUS | Status: AC
  Administered 2013-04-08: 3 g via INTRAVENOUS
  Filled 2013-04-07 (×2): qty 3000

## 2013-04-08 ENCOUNTER — Encounter (HOSPITAL_COMMUNITY)
Admission: RE | Disposition: A | Discharge status: Home / Self Care | Payer: Self-pay | Source: Ambulatory Visit | Attending: Neurological Surgery

## 2013-04-08 ENCOUNTER — Inpatient Hospital Stay (HOSPITAL_COMMUNITY): Payer: Medicare Other | Admitting: Anesthesiology

## 2013-04-08 ENCOUNTER — Inpatient Hospital Stay (HOSPITAL_COMMUNITY)
Admission: RE | Admit: 2013-04-08 | Discharge: 2013-04-09 | DRG: 460 | Disposition: A | Discharge status: Home / Self Care | Payer: Medicare Other | Source: Ambulatory Visit | Attending: Neurological Surgery | Admitting: Neurological Surgery

## 2013-04-08 ENCOUNTER — Encounter (HOSPITAL_COMMUNITY): Payer: Self-pay | Admitting: Neurological Surgery

## 2013-04-08 ENCOUNTER — Inpatient Hospital Stay (HOSPITAL_COMMUNITY): Payer: Medicare Other

## 2013-04-08 ENCOUNTER — Encounter (HOSPITAL_COMMUNITY): Payer: Medicare Other | Admitting: Anesthesiology

## 2013-04-08 DIAGNOSIS — M51379 Other intervertebral disc degeneration, lumbosacral region without mention of lumbar back pain or lower extremity pain: Principal | ICD-10-CM | POA: Diagnosis present

## 2013-04-08 DIAGNOSIS — Z9884 Bariatric surgery status: Secondary | ICD-10-CM

## 2013-04-08 DIAGNOSIS — M5137 Other intervertebral disc degeneration, lumbosacral region: Principal | ICD-10-CM | POA: Diagnosis present

## 2013-04-08 DIAGNOSIS — Z87891 Personal history of nicotine dependence: Secondary | ICD-10-CM

## 2013-04-08 DIAGNOSIS — E7800 Pure hypercholesterolemia, unspecified: Secondary | ICD-10-CM | POA: Diagnosis present

## 2013-04-08 DIAGNOSIS — Z7982 Long term (current) use of aspirin: Secondary | ICD-10-CM

## 2013-04-08 DIAGNOSIS — I10 Essential (primary) hypertension: Secondary | ICD-10-CM | POA: Diagnosis present

## 2013-04-08 DIAGNOSIS — Z79899 Other long term (current) drug therapy: Secondary | ICD-10-CM

## 2013-04-08 DIAGNOSIS — Z981 Arthrodesis status: Secondary | ICD-10-CM

## 2013-04-08 DIAGNOSIS — E669 Obesity, unspecified: Secondary | ICD-10-CM | POA: Diagnosis present

## 2013-04-08 HISTORY — PX: MAXIMUM ACCESS (MAS)POSTERIOR LUMBAR INTERBODY FUSION (PLIF) 1 LEVEL: SHX6368

## 2013-04-08 SURGERY — FOR MAXIMUM ACCESS (MAS) POSTERIOR LUMBAR INTERBODY FUSION (PLIF) 1 LEVEL
Anesthesia: General | Site: Back | Wound class: Clean

## 2013-04-08 MED ORDER — THROMBIN 5000 UNITS EX SOLR
OROMUCOSAL | Status: DC | PRN
  Administered 2013-04-08: 11:00:00 via TOPICAL

## 2013-04-08 MED ORDER — OXYCODONE HCL 5 MG PO TABS
ORAL_TABLET | ORAL | Status: AC
  Filled 2013-04-08: qty 1

## 2013-04-08 MED ORDER — DEXAMETHASONE SODIUM PHOSPHATE 10 MG/ML IJ SOLN: 10.0000 mg | INTRAMUSCULAR | Status: DC

## 2013-04-08 MED ORDER — ONDANSETRON HCL 4 MG/2ML IJ SOLN
INTRAMUSCULAR | Status: AC
  Administered 2013-04-08: 4 mg
  Filled 2013-04-08: qty 2

## 2013-04-08 MED ORDER — ACETAMINOPHEN 650 MG RE SUPP: 650.0000 mg | RECTAL | Status: DC | PRN

## 2013-04-08 MED ORDER — SODIUM CHLORIDE 0.9 % IJ SOLN: 3.0000 mL | INTRAMUSCULAR | Status: DC | PRN

## 2013-04-08 MED ORDER — ALBUMIN HUMAN 5 % IV SOLN
INTRAVENOUS | Status: DC | PRN
  Administered 2013-04-08: 11:00:00 via INTRAVENOUS

## 2013-04-08 MED ORDER — METHOCARBAMOL 500 MG PO TABS
500.0000 mg | ORAL_TABLET | Freq: Four times a day (QID) | ORAL | Status: DC | PRN
  Administered 2013-04-08 – 2013-04-09 (×3): 500 mg via ORAL
  Filled 2013-04-08 (×3): qty 1

## 2013-04-08 MED ORDER — ONDANSETRON HCL 4 MG/2ML IJ SOLN
INTRAMUSCULAR | Status: DC | PRN
  Administered 2013-04-08: 4 mg via INTRAVENOUS

## 2013-04-08 MED ORDER — THROMBIN 5000 UNITS EX SOLR
OROMUCOSAL | Status: DC | PRN
  Administered 2013-04-08: 10:00:00 via TOPICAL

## 2013-04-08 MED ORDER — PROPOFOL 10 MG/ML IV BOLUS
INTRAVENOUS | Status: DC | PRN
  Administered 2013-04-08: 250 mg via INTRAVENOUS

## 2013-04-08 MED ORDER — PROMETHAZINE HCL 25 MG/ML IJ SOLN: 6.2500 mg | INTRAMUSCULAR | Status: DC | PRN

## 2013-04-08 MED ORDER — LABETALOL HCL 5 MG/ML IV SOLN
INTRAVENOUS | Status: AC
  Filled 2013-04-08: qty 4

## 2013-04-08 MED ORDER — LACTATED RINGERS IV SOLN
INTRAVENOUS | Status: DC | PRN
  Administered 2013-04-08 (×2): via INTRAVENOUS

## 2013-04-08 MED ORDER — DEXAMETHASONE SODIUM PHOSPHATE 10 MG/ML IJ SOLN
INTRAMUSCULAR | Status: AC
  Administered 2013-04-08: 10 mg via INTRAVENOUS
  Filled 2013-04-08: qty 1

## 2013-04-08 MED ORDER — DEXAMETHASONE SODIUM PHOSPHATE 4 MG/ML IJ SOLN
4.0000 mg | Freq: Four times a day (QID) | INTRAMUSCULAR | Status: DC
  Administered 2013-04-08: 4 mg via INTRAVENOUS
  Filled 2013-04-08 (×5): qty 1

## 2013-04-08 MED ORDER — SODIUM CHLORIDE 0.9 % IR SOLN
Status: DC | PRN
  Administered 2013-04-08: 10:00:00

## 2013-04-08 MED ORDER — THROMBIN 20000 UNITS EX SOLR
CUTANEOUS | Status: DC | PRN
  Administered 2013-04-08: 10:00:00 via TOPICAL

## 2013-04-08 MED ORDER — METHOCARBAMOL 500 MG PO TABS
ORAL_TABLET | ORAL | Status: AC
  Administered 2013-04-08: 500 mg via ORAL
  Filled 2013-04-08: qty 1

## 2013-04-08 MED ORDER — LABETALOL HCL 5 MG/ML IV SOLN
10.0000 mg | INTRAVENOUS | Status: AC | PRN
  Administered 2013-04-08 (×2): 10 mg via INTRAVENOUS

## 2013-04-08 MED ORDER — PHENYLEPHRINE HCL 10 MG/ML IJ SOLN
10.0000 mg | INTRAVENOUS | Status: DC | PRN
  Administered 2013-04-08: 60 ug/min via INTRAVENOUS

## 2013-04-08 MED ORDER — BUPIVACAINE HCL (PF) 0.25 % IJ SOLN
INTRAMUSCULAR | Status: DC | PRN
  Administered 2013-04-08: 8 mL

## 2013-04-08 MED ORDER — DEXTROSE 5 % IV SOLN
500.0000 mg | Freq: Four times a day (QID) | INTRAVENOUS | Status: DC | PRN
  Filled 2013-04-08: qty 5

## 2013-04-08 MED ORDER — EPHEDRINE SULFATE 50 MG/ML IJ SOLN
INTRAMUSCULAR | Status: DC | PRN
  Administered 2013-04-08 (×2): 10 mg via INTRAVENOUS

## 2013-04-08 MED ORDER — CELECOXIB 200 MG PO CAPS
200.0000 mg | ORAL_CAPSULE | Freq: Two times a day (BID) | ORAL | Status: DC
  Administered 2013-04-08: 200 mg via ORAL
  Filled 2013-04-08 (×3): qty 1

## 2013-04-08 MED ORDER — HYDROMORPHONE HCL PF 1 MG/ML IJ SOLN
INTRAMUSCULAR | Status: AC
  Administered 2013-04-08: 0.5 mg via INTRAVENOUS
  Filled 2013-04-08: qty 1

## 2013-04-08 MED ORDER — OXYCODONE HCL 5 MG/5ML PO SOLN: 5.0000 mg | Freq: Once | ORAL | Status: AC | PRN

## 2013-04-08 MED ORDER — OXYCODONE-ACETAMINOPHEN 5-325 MG PO TABS
1.0000 | ORAL_TABLET | ORAL | Status: DC | PRN
  Administered 2013-04-08 – 2013-04-09 (×2): 2 via ORAL
  Filled 2013-04-08: qty 2

## 2013-04-08 MED ORDER — GLYCOPYRROLATE 0.2 MG/ML IJ SOLN
INTRAMUSCULAR | Status: DC | PRN
  Administered 2013-04-08: 0.2 mg via INTRAVENOUS

## 2013-04-08 MED ORDER — 0.9 % SODIUM CHLORIDE (POUR BTL) OPTIME
TOPICAL | Status: DC | PRN
  Administered 2013-04-08: 1000 mL

## 2013-04-08 MED ORDER — HYDROMORPHONE HCL PF 1 MG/ML IJ SOLN
0.2500 mg | INTRAMUSCULAR | Status: DC | PRN
  Administered 2013-04-08 (×4): 0.5 mg via INTRAVENOUS

## 2013-04-08 MED ORDER — SODIUM CHLORIDE 0.9 % IJ SOLN
3.0000 mL | Freq: Two times a day (BID) | INTRAMUSCULAR | Status: DC
  Administered 2013-04-08: 3 mL via INTRAVENOUS

## 2013-04-08 MED ORDER — LABETALOL HCL 5 MG/ML IV SOLN
10.0000 mg | Freq: Once | INTRAVENOUS | Status: AC
  Administered 2013-04-08: 10 mg via INTRAVENOUS

## 2013-04-08 MED ORDER — MORPHINE SULFATE 2 MG/ML IJ SOLN
1.0000 mg | INTRAMUSCULAR | Status: DC | PRN
  Administered 2013-04-08: 2 mg via INTRAVENOUS
  Filled 2013-04-08: qty 1

## 2013-04-08 MED ORDER — MIDAZOLAM HCL 5 MG/5ML IJ SOLN
INTRAMUSCULAR | Status: DC | PRN
  Administered 2013-04-08: 2 mg via INTRAVENOUS

## 2013-04-08 MED ORDER — ASPIRIN EC 81 MG PO TBEC
81.0000 mg | DELAYED_RELEASE_TABLET | Freq: Every day | ORAL | Status: DC
  Filled 2013-04-08 (×2): qty 1

## 2013-04-08 MED ORDER — LABETALOL HCL 5 MG/ML IV SOLN
INTRAVENOUS | Status: AC
  Administered 2013-04-08: 10 mg
  Filled 2013-04-08: qty 4

## 2013-04-08 MED ORDER — SUFENTANIL CITRATE 50 MCG/ML IV SOLN
INTRAVENOUS | Status: DC | PRN
  Administered 2013-04-08: 10 ug via INTRAVENOUS
  Administered 2013-04-08: 5 ug via INTRAVENOUS
  Administered 2013-04-08: 15 ug via INTRAVENOUS

## 2013-04-08 MED ORDER — PHENOL 1.4 % MT LIQD: 1.0000 | OROMUCOSAL | Status: DC | PRN

## 2013-04-08 MED ORDER — DEXAMETHASONE 4 MG PO TABS
4.0000 mg | ORAL_TABLET | Freq: Four times a day (QID) | ORAL | Status: DC
  Administered 2013-04-08 – 2013-04-09 (×2): 4 mg via ORAL
  Filled 2013-04-08 (×6): qty 1

## 2013-04-08 MED ORDER — ACETAMINOPHEN 325 MG PO TABS: 650.0000 mg | ORAL_TABLET | ORAL | Status: DC | PRN

## 2013-04-08 MED ORDER — LIDOCAINE HCL (CARDIAC) 20 MG/ML IV SOLN
INTRAVENOUS | Status: DC | PRN
  Administered 2013-04-08: 100 mg via INTRAVENOUS

## 2013-04-08 MED ORDER — LIDOCAINE HCL 4 % MT SOLN
OROMUCOSAL | Status: DC | PRN
  Administered 2013-04-08: 100 mL via TOPICAL

## 2013-04-08 MED ORDER — HYDROMORPHONE HCL PF 1 MG/ML IJ SOLN
INTRAMUSCULAR | Status: AC
  Filled 2013-04-08: qty 1

## 2013-04-08 MED ORDER — OXYCODONE HCL 5 MG PO TABS
5.0000 mg | ORAL_TABLET | Freq: Once | ORAL | Status: AC | PRN
Start: 2013-04-08 — End: 2013-04-08
  Administered 2013-04-08: 5 mg via ORAL

## 2013-04-08 MED ORDER — OXYCODONE-ACETAMINOPHEN 5-325 MG PO TABS
ORAL_TABLET | ORAL | Status: AC
  Administered 2013-04-08: 2 via ORAL
  Filled 2013-04-08: qty 2

## 2013-04-08 MED ORDER — POTASSIUM CHLORIDE IN NACL 20-0.9 MEQ/L-% IV SOLN
INTRAVENOUS | Status: DC
  Administered 2013-04-08: 18:00:00 via INTRAVENOUS
  Filled 2013-04-08 (×3): qty 1000

## 2013-04-08 MED ORDER — DOXAZOSIN MESYLATE 8 MG PO TABS
8.0000 mg | ORAL_TABLET | Freq: Every morning | ORAL | Status: DC
  Filled 2013-04-08: qty 1

## 2013-04-08 MED ORDER — CEFAZOLIN SODIUM 1-5 GM-% IV SOLN
1.0000 g | Freq: Three times a day (TID) | INTRAVENOUS | Status: AC
  Administered 2013-04-08 – 2013-04-09 (×2): 1 g via INTRAVENOUS
  Filled 2013-04-08 (×2): qty 50

## 2013-04-08 MED ORDER — FENTANYL CITRATE 0.05 MG/ML IJ SOLN: 50.0000 ug | Freq: Once | INTRAMUSCULAR | Status: DC

## 2013-04-08 MED ORDER — ARTIFICIAL TEARS OP OINT
TOPICAL_OINTMENT | OPHTHALMIC | Status: DC | PRN
  Administered 2013-04-08: 1 via OPHTHALMIC

## 2013-04-08 MED ORDER — ASPIRIN 81 MG PO TABS: 81.0000 mg | ORAL_TABLET | Freq: Every day | ORAL | Status: DC

## 2013-04-08 MED ORDER — LACTATED RINGERS IV SOLN
INTRAVENOUS | Status: DC
  Administered 2013-04-08: 08:00:00 via INTRAVENOUS

## 2013-04-08 MED ORDER — MENTHOL 3 MG MT LOZG: 1.0000 | LOZENGE | OROMUCOSAL | Status: DC | PRN

## 2013-04-08 MED ORDER — MIDAZOLAM HCL 2 MG/2ML IJ SOLN: 1.0000 mg | INTRAMUSCULAR | Status: DC | PRN

## 2013-04-08 MED ORDER — ENALAPRIL MALEATE 20 MG PO TABS
20.0000 mg | ORAL_TABLET | Freq: Every day | ORAL | Status: DC
  Administered 2013-04-08: 20 mg via ORAL
  Filled 2013-04-08 (×2): qty 1

## 2013-04-08 MED ORDER — ONDANSETRON HCL 4 MG/2ML IJ SOLN: 4.0000 mg | INTRAMUSCULAR | Status: DC | PRN

## 2013-04-08 SURGICAL SUPPLY — 65 items
BAG DECANTER FOR FLEXI CONT (MISCELLANEOUS) ×2 IMPLANT
BENZOIN TINCTURE PRP APPL 2/3 (GAUZE/BANDAGES/DRESSINGS) ×2 IMPLANT
BLADE SURG ROTATE 9660 (MISCELLANEOUS) IMPLANT
BONE MATRIX OSTEOCEL PRO MED (Bone Implant) ×2 IMPLANT
BUR MATCHSTICK NEURO 3.0 LAGG (BURR) ×2 IMPLANT
CAGE PLIF MAS 9X8X28-4 LUMBAR (Cage) ×4 IMPLANT
CANISTER SUCT 3000ML (MISCELLANEOUS) ×2 IMPLANT
CLIP NEUROVISION LG (CLIP) ×2 IMPLANT
CONT SPEC 4OZ CLIKSEAL STRL BL (MISCELLANEOUS) ×4 IMPLANT
COVER BACK TABLE 24X17X13 BIG (DRAPES) IMPLANT
COVER TABLE BACK 60X90 (DRAPES) ×2 IMPLANT
DRAPE C-ARM 42X72 X-RAY (DRAPES) ×2 IMPLANT
DRAPE C-ARMOR (DRAPES) ×2 IMPLANT
DRAPE LAPAROTOMY 100X72X124 (DRAPES) ×2 IMPLANT
DRAPE POUCH INSTRU U-SHP 10X18 (DRAPES) ×2 IMPLANT
DRAPE SURG 17X23 STRL (DRAPES) ×2 IMPLANT
DRESSING TELFA 8X3 (GAUZE/BANDAGES/DRESSINGS) IMPLANT
DRSG OPSITE 4X5.5 SM (GAUZE/BANDAGES/DRESSINGS) ×2 IMPLANT
DRSG OPSITE POSTOP 4X6 (GAUZE/BANDAGES/DRESSINGS) ×2 IMPLANT
DURAPREP 26ML APPLICATOR (WOUND CARE) ×2 IMPLANT
ELECT REM PT RETURN 9FT ADLT (ELECTROSURGICAL) ×2
ELECTRODE REM PT RTRN 9FT ADLT (ELECTROSURGICAL) ×1 IMPLANT
EVACUATOR 1/8 PVC DRAIN (DRAIN) ×2 IMPLANT
GAUZE SPONGE 4X4 16PLY XRAY LF (GAUZE/BANDAGES/DRESSINGS) IMPLANT
GLOVE BIO SURGEON STRL SZ8 (GLOVE) ×4 IMPLANT
GLOVE BIOGEL PI IND STRL 7.5 (GLOVE) ×1 IMPLANT
GLOVE BIOGEL PI IND STRL 8.5 (GLOVE) ×2 IMPLANT
GLOVE BIOGEL PI INDICATOR 7.5 (GLOVE) ×1
GLOVE BIOGEL PI INDICATOR 8.5 (GLOVE) ×2
GLOVE ECLIPSE 7.5 STRL STRAW (GLOVE) ×4 IMPLANT
GLOVE SURG SS PI 8.0 STRL IVOR (GLOVE) ×6 IMPLANT
GOWN BRE IMP SLV AUR LG STRL (GOWN DISPOSABLE) IMPLANT
GOWN BRE IMP SLV AUR XL STRL (GOWN DISPOSABLE) ×4 IMPLANT
GOWN STRL REIN 2XL LVL4 (GOWN DISPOSABLE) ×2 IMPLANT
HEMOSTAT POWDER KIT SURGIFOAM (HEMOSTASIS) ×2 IMPLANT
KIT BASIN OR (CUSTOM PROCEDURE TRAY) ×2 IMPLANT
KIT NEEDLE NVM5 EMG ELECT (KITS) ×1 IMPLANT
KIT NEEDLE NVM5 EMG ELECTRODE (KITS) ×1
KIT ROOM TURNOVER OR (KITS) ×2 IMPLANT
MILL MEDIUM DISP (BLADE) ×2 IMPLANT
NEEDLE HYPO 25X1 1.5 SAFETY (NEEDLE) ×2 IMPLANT
NEURO MONITORING STIM (LABOR (TRAVEL & OVERTIME)) ×2 IMPLANT
NS IRRIG 1000ML POUR BTL (IV SOLUTION) ×2 IMPLANT
PACK LAMINECTOMY NEURO (CUSTOM PROCEDURE TRAY) ×2 IMPLANT
PAD ARMBOARD 7.5X6 YLW CONV (MISCELLANEOUS) ×6 IMPLANT
PATTIES SURGICAL 1X1 (DISPOSABLE) ×2 IMPLANT
ROD 5.5X40MM (Rod) ×4 IMPLANT
SCREW LOCK (Screw) ×4 IMPLANT
SCREW LOCK FXNS SPNE MAS PL (Screw) ×4 IMPLANT
SCREW PLIF MAS 5.0X35 (Screw) ×4 IMPLANT
SCREW SHANK 5.0X30MM (Screw) ×4 IMPLANT
SCREW TULIP 5.5 (Screw) ×4 IMPLANT
SPONGE LAP 4X18 X RAY DECT (DISPOSABLE) IMPLANT
SPONGE SURGIFOAM ABS GEL 100 (HEMOSTASIS) ×2 IMPLANT
STRIP CLOSURE SKIN 1/2X4 (GAUZE/BANDAGES/DRESSINGS) ×2 IMPLANT
SUT VIC AB 0 CT1 18XCR BRD8 (SUTURE) ×1 IMPLANT
SUT VIC AB 0 CT1 8-18 (SUTURE) ×1
SUT VIC AB 2-0 CP2 18 (SUTURE) ×2 IMPLANT
SUT VIC AB 3-0 SH 8-18 (SUTURE) ×2 IMPLANT
SYR 20ML ECCENTRIC (SYRINGE) ×2 IMPLANT
SYR 3ML LL SCALE MARK (SYRINGE) ×8 IMPLANT
TOWEL OR 17X24 6PK STRL BLUE (TOWEL DISPOSABLE) ×2 IMPLANT
TOWEL OR 17X26 10 PK STRL BLUE (TOWEL DISPOSABLE) ×2 IMPLANT
TRAY FOLEY CATH 16FRSI W/METER (SET/KITS/TRAYS/PACK) ×2 IMPLANT
WATER STERILE IRR 1000ML POUR (IV SOLUTION) ×2 IMPLANT

## 2013-04-08 NOTE — Anesthesia Preprocedure Evaluation (Signed)
Anesthesia Evaluation  Patient identified by MRN, date of birth, ID band Patient awake    Reviewed: Allergy & Precautions, H&P , NPO status , Patient's Chart, lab work & pertinent test results  Airway Mallampati: II TM Distance: <3 FB Neck ROM: Full    Dental   Pulmonary former smoker,  breath sounds clear to auscultation        Cardiovascular hypertension, Rhythm:Regular Rate:Normal     Neuro/Psych    GI/Hepatic   Endo/Other  Morbid obesity  Renal/GU      Musculoskeletal   Abdominal (+) + obese,   Peds  Hematology   Anesthesia Other Findings   Reproductive/Obstetrics                           Anesthesia Physical Anesthesia Plan  ASA: III  Anesthesia Plan: General   Post-op Pain Management:    Induction: Intravenous  Airway Management Planned: Oral ETT and Video Laryngoscope Planned  Additional Equipment:   Intra-op Plan:   Post-operative Plan: Extubation in OR  Informed Consent: I have reviewed the patients History and Physical, chart, labs and discussed the procedure including the risks, benefits and alternatives for the proposed anesthesia with the patient or authorized representative who has indicated his/her understanding and acceptance.     Plan Discussed with: CRNA and Surgeon  Anesthesia Plan Comments:         Anesthesia Quick Evaluation

## 2013-04-08 NOTE — Op Note (Signed)
.04/08/2013  12:27 PM  PATIENT:  Philip [name]  67 y.o. male  PRE-OPERATIVE DIAGNOSIS:  Lumbar degenerative disc disease with recurrent spinal stenosis L2-3 with leg pain and leg weakness  POST-OPERATIVE DIAGNOSIS:  Same  PROCEDURE:   1. Decompressive lumbar laminectomy L2-3 requiring more work than would be required for a simple exposure of the disk for PLIF in order to adequately decompress the neural elements and address the spinal stenosis 2. Posterior lumbar interbody fusion L2-3 using PEEK interbody cages packed with morcellized allograft and autograft 3. Posterior fixation L2-3 using cortical pedicle screws.  4. Intertransverse arthrodesis L2-3 using morcellized autograft and allograft.  SURGEON:  Marikay Alar, MD  ASSISTANTS: Dr. Newell Coral  ANESTHESIA:  General  EBL: 700 ml  Total I/O In: 2750 [I.V.:2000; IV Piggyback:750] Out: 800 [Urine:100; Blood:700]  BLOOD ADMINISTERED:none  DRAINS: Hemovac   INDICATION FOR PROCEDURE: This patient presented with bilateral anterior thigh pain with ambulation. He also complained of weakness in his legs. He had a previous decompressive laminectomy at L2-3 in the remote past. MRI showed recurrent stenosis at this level. I recommended a decompression and instrumented fusion. Patient understood the risks, benefits, and alternatives and potential outcomes and wished to proceed.  PROCEDURE DETAILS:  The patient was brought to the operating room. After induction of generalized endotracheal anesthesia the patient was rolled into the prone position on chest rolls and all pressure points were padded. The patient's lumbar region was cleaned and then prepped with DuraPrep and draped in the usual sterile fashion. Anesthesia was injected and then a dorsal midline incision was made and carried down to the lumbosacral fascia. The fascia was opened and the paraspinous musculature was taken down in a subperiosteal fashion to expose L2-3. A self-retaining  retractor was placed. Intraoperative fluoroscopy confirmed my level, and I started with placement of the L2 cortical pedicle screws. The pedicle screw entry zones were identified utilizing surface landmarks and  AP and lateral fluoroscopy. I scored the cortex with the high-speed drill and then used the hand drill and EMG monitoring to drill an upward and outward direction into the pedicle. I then tapped line to line, and the tap was also monitored. I then placed a 5-0 by 30 mm cortical pedicle screw into the pedicles of L2 bilaterally. I then turned my attention to the decompression and the spinous process was removed and complete lumbar laminectomies, hemi- facetectomies, and foraminotomies were performed at L2-3. The patient had significant spinal stenosis and this required more work than would be required for a simple exposure of the disc for posterior lumbar interbody fusion. Much more generous decompression was undertaken in order to adequately decompress the neural elements and address the patient's leg pain. The yellow ligament was removed to expose the underlying dura and nerve roots, and generous foraminotomies were performed to adequately decompress the neural elements. Both the exiting and traversing nerve roots were decompressed on both sides until a coronary dilator passed easily along the nerve roots. Once the decompression was complete, I turned my attention to the posterior lower lumbar interbody fusion. The epidural venous vasculature was coagulated and cut sharply. Disc space was incised and the initial discectomy was performed with pituitary rongeurs. The disc space was distracted with sequential distractors to a height of 8 mm. We then used a series of scrapers and shavers to prepare the endplates for fusion. The midline was prepared with Epstein curettes. Once the complete discectomy was finished, we packed an appropriate sized peek interbody cage with  local autograft and morcellized allograft,  gently retracted the nerve root, and tapped the cage into position at L2-3 bilaterally.  The midline between the cages was packed with morselized autograft and allograft. We then turned our attention to the placement of the lower pedicle screws. The pedicle screw entry zones were identified utilizing surface landmarks and fluoroscopy. I drilled into each pedicle utilizing the hand drill and EMG monitoring, and tapped each pedicle with the appropriate tap. We palpated with a ball probe to assure no break in the cortex. We then placed 5-0 by 35 mm pedicle screws into the pedicles bilaterally at L3. We then decorticated the transverse processes and laid a mixture of morcellized autograft and allograft out over these to perform intertransverse arthrodesis at L2-3. We then placed lordotic rods into the multiaxial screw heads of the pedicle screws and locked these in position with the locking caps and anti-torque device. We then checked our construct with AP and lateral fluoroscopy. Irrigated with copious amounts of bacitracin-containing saline solution. Placed a medium Hemovac drain through separate stab incision. Inspected the nerve roots once again to assure adequate decompression, lined to the dura with Gelfoam, and closed the muscle and the fascia with 0 Vicryl. Closed the subcutaneous tissues with 2-0 Vicryl and subcuticular tissues with 3-0 Vicryl. The skin was closed with benzoin and Steri-Strips. Dressing was then applied, the patient was awakened from general anesthesia and transported to the recovery room in stable condition. At the end of the procedure all sponge, needle and instrument counts were correct.   PLAN OF CARE: Admit to inpatient   PATIENT DISPOSITION:  PACU - hemodynamically stable.   Delay start of Pharmacological VTE agent (>24hrs) due to surgical blood loss or risk of bleeding:  yes

## 2013-04-08 NOTE — H&P (Signed)
Subjective: Patient is a 67 y.o. male admitted for PLIF L2-3. Onset of symptoms was several months ago, gradually worsening since that time.  The pain is rated moderate, and is located at the across the lower back and radiates to quads. Legs feel functionally weak. The pain is described as aching and burning and occurs all day. The symptoms have been progressive. Symptoms are exacerbated by exercise. MRI or CT showed stenosis L2-3, recurrent.   Past Medical History  Diagnosis Date  . Hypertension   . Obesity   . Hypercholesterolemia   . Arthritis     Past Surgical History  Procedure Laterality Date  . Laparoscopic gastric banding  11/25/07  . Back surgery  2000    lumbar fusion  . Joint replacement Right 2010  . Joint replacement Left 2010  . Fracture surgery      arm fx as a child  . Foot  drop surgery Left     left calf    Prior to Admission medications   Medication Sig Start Date End Date Taking? Authorizing Provider  aspirin 81 MG tablet Take 81 mg by mouth daily.   Yes Historical Provider, MD  atorvastatin (LIPITOR) 40 MG tablet Take 40 mg by mouth daily.   Yes Historical Provider, MD  doxazosin (CARDURA) 8 MG tablet Take 8 mg by mouth every morning.    Yes Historical Provider, MD  enalapril (VASOTEC) 20 MG tablet Take 20 mg by mouth daily.   Yes Historical Provider, MD  oxyCODONE-acetaminophen (PERCOCET/ROXICET) 5-325 MG per tablet Take 1 tablet by mouth every 4 (four) hours as needed for pain.   Yes Historical Provider, MD   No Known Allergies  History  Substance Use Topics  . Smoking status: Former Smoker -- 1.00 packs/day for 10 years    Types: Cigarettes    Quit date: 03/01/1975  . Smokeless tobacco: Never Used  . Alcohol Use: Yes     Comment: 3-4 drinks a year    Family History  Problem Relation Age of Onset  . Colon cancer Neg Hx      Review of Systems  Positive ROS: neg  All other systems have been reviewed and were otherwise negative with the exception  of those mentioned in the HPI and as above.  Objective: Vital signs in last 24 hours: Temp:  [97.1 F (36.2 C)] 97.1 F (36.2 C) (11/12 0801) Pulse Rate:  [78] 78 (11/12 0801) Resp:  [20] 20 (11/12 0801) BP: (196)/(91) 196/91 mmHg (11/12 0801) SpO2:  [98 %] 98 % (11/12 0801)  General Appearance: Alert, cooperative, no distress, appears stated age Head: Normocephalic, without obvious abnormality, atraumatic Eyes: PERRL, conjunctiva/corneas clear, EOM's intact    Neck: Supple, symmetrical, trachea midline Back: Symmetric, no curvature, ROM normal, no CVA tenderness Lungs:  respirations unlabored Heart: Regular rate and rhythm Abdomen: Soft, non-tender Extremities: Extremities normal, atraumatic, no cyanosis or edema Pulses: 2+ and symmetric all extremities Skin: Skin color, texture, turgor normal, no rashes or lesions  NEUROLOGIC:   Mental status: Alert and oriented x4,  no aphasia, good attention span, fund of knowledge, and memory Motor Exam - grossly normal Sensory Exam - grossly normal Reflexes: 1+ Coordination - grossly normal Gait - grossly normal Balance - grossly normal Cranial Nerves: I: smell Not tested  II: visual acuity  OS: nl    OD: nl  II: visual fields Full to confrontation  II: pupils Equal, round, reactive to light  III,VII: ptosis None  III,IV,VI: extraocular muscles  Full  ROM  V: mastication Normal  V: facial light touch sensation  Normal  V,VII: corneal reflex  Present  VII: facial muscle function - upper  Normal  VII: facial muscle function - lower Normal  VIII: hearing Not tested  IX: soft palate elevation  Normal  IX,X: gag reflex Present  XI: trapezius strength  5/5  XI: sternocleidomastoid strength 5/5  XI: neck flexion strength  5/5  XII: tongue strength  Normal    Data Review Lab Results  Component Value Date   WBC 8.2 03/31/2013   HGB 11.4* 03/31/2013   HCT 33.0* 03/31/2013   MCV 81.3 03/31/2013   PLT 233 03/31/2013   Lab Results   Component Value Date   NA 141 03/31/2013   K 4.2 03/31/2013   CL 106 03/31/2013   CO2 25 03/31/2013   BUN 11 03/31/2013   CREATININE 0.59 03/31/2013   GLUCOSE 102* 03/31/2013   Lab Results  Component Value Date   INR 1.07 03/31/2013    Assessment/Plan: Patient admitted for PLIF L2-3. Patient has failed a reasonable attempt at conservative therapy.  I explained the condition and procedure to the patient and answered any questions.  Patient wishes to proceed with procedure as planned. Understands risks/ benefits and typical outcomes of procedure.   Haydon Kalmar S 04/08/2013 9:26 AM

## 2013-04-08 NOTE — Progress Notes (Signed)
UR COMPLETED  

## 2013-04-08 NOTE — OR Nursing (Signed)
Neuro monitoring provided by [employer]. Needle electrodes were placed by Sandy Salaam, RN and Kathleene Hazel, RN, and placement was approved by [employer] rep., Sharol Roussel. Needle electrodes were removed at the end of case by Sandy Salaam, RN.

## 2013-04-08 NOTE — Plan of Care (Signed)
Problem: Consults Goal: Diagnosis - Spinal Surgery Outcome: Completed/Met Date Met:  04/08/13 Thoraco/Lumbar Spine Fusion

## 2013-04-08 NOTE — Transfer of Care (Signed)
Immediate Anesthesia Transfer of Care Note  Patient: Philip [name]  Procedure(s) Performed: Procedure(s) with comments: Lumbar two-three maximum access surgery posterior lumbar interbody fusion (N/A) - STIM monitoring  Patient Location: PACU  Anesthesia Type:General  Level of Consciousness: awake and alert   Airway & Oxygen Therapy: Patient Spontanous Breathing and Patient connected to face mask oxygen  Post-op Assessment: Report given to PACU RN and Post -op Vital signs reviewed and stable  Post vital signs: Reviewed and stable  Complications: No apparent anesthesia complications

## 2013-04-08 NOTE — Anesthesia Procedure Notes (Signed)
Procedure Name: Intubation Date/Time: 04/08/2013 9:42 AM Performed by: Coralee Rud Pre-anesthesia Checklist: Patient identified, Emergency Drugs available, Suction available and Patient being monitored Patient Re-evaluated:Patient Re-evaluated prior to inductionOxygen Delivery Method: Circle system utilized Preoxygenation: Pre-oxygenation with 100% oxygen Intubation Type: IV induction Ventilation: Mask ventilation without difficulty Laryngoscope size: Elective Glidescope. Grade View: Grade I Tube type: Oral Tube size: 8.5 mm Number of attempts: 1 Airway Equipment and Method: Stylet and Video-laryngoscopy Placement Confirmation: ETT inserted through vocal cords under direct vision,  positive ETCO2 and breath sounds checked- equal and bilateral Secured at: 22 cm Tube secured with: Tape Dental Injury: Teeth and Oropharynx as per pre-operative assessment

## 2013-04-08 NOTE — Anesthesia Postprocedure Evaluation (Signed)
  Anesthesia Post-op Note  Patient: Philip [name]  Procedure(s) Performed: Procedure(s) with comments: Lumbar two-three maximum access surgery posterior lumbar interbody fusion (N/A) - STIM monitoring  Patient Location: PACU  Anesthesia Type:General  Level of Consciousness: awake and alert   Airway and Oxygen Therapy: Patient Spontanous Breathing  Post-op Pain: mild  Post-op Assessment: Post-op Vital signs reviewed, Patient's Cardiovascular Status Stable, Respiratory Function Stable, Patent Airway, No signs of Nausea or vomiting and Pain level controlled  Post-op Vital Signs: Reviewed and stable  Complications: No apparent anesthesia complications

## 2013-04-08 NOTE — Preoperative (Signed)
Beta Blockers   Reason not to administer Beta Blockers:Not Applicable 

## 2013-04-09 MED ORDER — OXYCODONE-ACETAMINOPHEN 5-325 MG PO TABS: 1.0000 | ORAL_TABLET | ORAL | Status: DC | PRN

## 2013-04-09 MED ORDER — METHOCARBAMOL 500 MG PO TABS: 500.0000 mg | ORAL_TABLET | Freq: Four times a day (QID) | ORAL | Status: DC | PRN

## 2013-04-09 NOTE — Evaluation (Addendum)
Occupational Therapy Evaluation Patient Details Name: Philip [name] MRN: [id number] DOB: Dec 17, 1945 Today's Date: 04/09/2013 Time: 8657-8469 OT Time Calculation (min): 12 min  OT Assessment / Plan / Recommendation History of present illness Pt is s/p PLIF L2-3 on 04/08/13   Clinical Impression   Pt presents w/ dx as above. This is his second back surgery, he demonstrated good body mechanics and log roll tech as well as toilet transfers. He was educated in back precautions (handout issued/reviewed) & demonstrated don/doff spinal brace in sitting. He has comfort height toilet at home & will have assistance from his spouse PRN. He reports no further OT needs at this time. Will sign off.    OT Assessment  Patient does not need any further OT services    Follow Up Recommendations  No OT follow up    Barriers to Discharge      Equipment Recommendations  None recommended by OT;Other (comment) (Pt has comfort height toilet)    Recommendations for Other Services    Frequency       Precautions / Restrictions Precautions Precautions: Back Required Braces or Orthoses: Spinal Brace Spinal Brace: Applied in sitting position;Lumbar corset Restrictions Weight Bearing Restrictions: No   Pertinent Vitals/Pain Denies pain, no c/o.    ADL  Eating/Feeding: Simulated;Independent Where Assessed - Eating/Feeding: Bed level Grooming: Simulated;Supervision/safety (Standing at sink in bathroom) Where Assessed - Grooming: Supported standing;Unsupported standing Upper Body Bathing: Simulated;Min guard;Set up Where Assessed - Upper Body Bathing: Unsupported sitting Lower Body Bathing: Simulated;Minimal assistance (Secondary to back precautions) Where Assessed - Lower Body Bathing: Supported sit to stand Upper Body Dressing: Simulated;Set up;Min guard Where Assessed - Upper Body Dressing: Unsupported sitting Lower Body Dressing: Performed;Minimal assistance Where Assessed - Lower Body Dressing:  Supported sit to stand Toilet Transfer: Research scientist (life sciences) Method: Sit to Barista: Comfort height toilet;Grab bars Toileting - Architect and Hygiene: Performed;Supervision/safety Where Assessed - Engineer, mining and Hygiene: Sit to stand from 3-in-1 or toilet Tub/Shower Transfer Method: Not assessed Equipment Used: Back brace;Rolling walker Transfers/Ambulation Related to ADLs: Pt w/ noted left foot drop as part of PMH. He ambulates w/ SPC for distances at home, currently ambulating w/ RW here in hospital at supervision/min guard level. ADL Comments: Pt was educated in role of OT verbally. He was also educated in proper don/doff of back brace in sitting as well as verbally educated in tub/shower trnasfer & LB dressing. Pt reports that he will have assistance PRN from his wife at d/c. Has comfort height toilet.    OT Diagnosis:    OT Problem List:   OT Treatment Interventions:     OT Goals(Current goals can be found in the care plan section) Acute Rehab OT Goals Patient Stated Goal: Go home later today w/ wife assist PRN  Visit Information  Last OT Received On: 04/09/13 Assistance Needed: +1 History of Present Illness: Pt is s/p PLIF L2-3 on 04/08/13       Prior Functioning     Home Living Family/patient expects to be discharged to:: Private residence Living Arrangements: Spouse/significant other Available Help at Discharge: Family Type of Home: House Home Access: Stairs to enter Secretary/administrator of Steps: 4 Entrance Stairs-Rails: Right Home Layout: One level Home Equipment: Hand held shower head;Cane - single point Prior Function Level of Independence: Independent with assistive device(s) Comments: Pt reports that he ambulates w/ cane secondary to h/o left foot drop. Communication Communication: No difficulties Dominant Hand: Right    Vision/Perception Vision -  History Patient Visual  Report: No change from baseline   Cognition  Cognition Arousal/Alertness: Awake/alert Behavior During Therapy: WFL for tasks assessed/performed Overall Cognitive Status: Within Functional Limits for tasks assessed    Extremity/Trunk Assessment Upper Extremity Assessment Upper Extremity Assessment: Overall WFL for tasks assessed Lower Extremity Assessment Lower Extremity Assessment: Defer to PT evaluation;LLE deficits/detail LLE Deficits / Details: Left foot drop, ambulates w/ cane    Mobility Bed Mobility Bed Mobility: Rolling Right;Right Sidelying to Sit;Sitting - Scoot to Edge of Bed Rolling Right: 5: Supervision Right Sidelying to Sit: 5: Supervision;HOB flat Sitting - Scoot to Edge of Bed: 6: Modified independent (Device/Increase time) Details for Bed Mobility Assistance: Pt demonstrates good technique for log roll. Transfers Transfers: Sit to Stand;Stand to Sit Sit to Stand: 5: Supervision;From bed;From toilet;With upper extremity assist Stand to Sit: 5: Supervision;To bed;To toilet;With upper extremity assist Details for Transfer Assistance: Supervision level transfers w/ ocassional vc's for back precautions during transfers to toilet/bed as well as for Left foot drop.         Balance Balance Balance Assessed: No   End of Session OT - End of Session Equipment Utilized During Treatment: Rolling walker;Back brace Activity Tolerance: Patient tolerated treatment well Patient left: in bed;with call bell/phone within reach;Other (comment) (Sitting EOB w/ PT entering room) Nurse Communication: Mobility status;Precautions  GO     Alm Bustard 04/09/2013, 8:33 AM

## 2013-04-09 NOTE — Discharge Summary (Signed)
Physician Discharge Summary  Patient ID: Philip [name] MRN: [id number] DOB/AGE: 1945/07/03 67 y.o.  Admit date: 04/08/2013 Discharge date: 04/09/2013  Admission Diagnoses: lumbar stenosis with instability   Discharge Diagnoses: same   Discharged Condition: good  Hospital Course: The patient was admitted on 04/08/2013 and taken to the operating room where the patient underwent PLIF L23. The patient tolerated the procedure well and was taken to the recovery room and then to the floor in stable condition. The hospital course was routine. There were no complications. The wound remained clean dry and intact. Pt had appropriate back soreness. No complaints of leg pain or new N/T/W. The patient remained afebrile with stable vital signs, and tolerated a regular diet. The patient continued to increase activities, and pain was well controlled with oral pain medications.   Consults: None  Significant Diagnostic Studies:  Results for orders placed during the hospital encounter of 03/31/13  SURGICAL PCR SCREEN      Result Value Range   MRSA, PCR NEGATIVE  NEGATIVE   Staphylococcus aureus NEGATIVE  NEGATIVE  BASIC METABOLIC PANEL      Result Value Range   Sodium 141  135 - 145 mEq/L   Potassium 4.2  3.5 - 5.1 mEq/L   Chloride 106  96 - 112 mEq/L   CO2 25  19 - 32 mEq/L   Glucose, Bld 102 (*) 70 - 99 mg/dL   BUN 11  6 - 23 mg/dL   Creatinine, Ser 4.09  0.50 - 1.35 mg/dL   Calcium 9.7  8.4 - 81.1 mg/dL   GFR calc non Af Amer >90  >90 mL/min   GFR calc Af Amer >90  >90 mL/min  CBC WITH DIFFERENTIAL      Result Value Range   WBC 8.2  4.0 - 10.5 K/uL   RBC 4.06 (*) 4.22 - 5.81 MIL/uL   Hemoglobin 11.4 (*) 13.0 - 17.0 g/dL   HCT 91.4 (*) 78.2 - 95.6 %   MCV 81.3  78.0 - 100.0 fL   MCH 28.1  26.0 - 34.0 pg   MCHC 34.5  30.0 - 36.0 g/dL   RDW 21.3  08.6 - 57.8 %   Platelets 233  150 - 400 K/uL   Neutrophils Relative % 60  43 - 77 %   Neutro Abs 4.9  1.7 - 7.7 K/uL   Lymphocytes  Relative 26  12 - 46 %   Lymphs Abs 2.1  0.7 - 4.0 K/uL   Monocytes Relative 9  3 - 12 %   Monocytes Absolute 0.8  0.1 - 1.0 K/uL   Eosinophils Relative 4  0 - 5 %   Eosinophils Absolute 0.3  0.0 - 0.7 K/uL   Basophils Relative 1  0 - 1 %   Basophils Absolute 0.1  0.0 - 0.1 K/uL  PROTIME-INR      Result Value Range   Prothrombin Time 13.7  11.6 - 15.2 seconds   INR 1.07  0.00 - 1.49  TYPE AND SCREEN      Result Value Range   ABO/RH(D) O POS     Antibody Screen NEG     Sample Expiration 04/14/2013      Dg Chest 2 View  03/31/2013   CLINICAL DATA:  Hypertension.  EXAM: CHEST  2 VIEW  COMPARISON:  Chest radiograph 02/15/2009.  FINDINGS: Stable cardiac and mediastinal contours. No consolidative pulmonary opacities. No pleural effusion or pneumothorax. Mid thoracic spine degenerative change. Prior gastric banding procedure.  IMPRESSION:  No active cardiopulmonary disease.   Electronically Signed   By: Annia Belt M.D.   On: 03/31/2013 13:45   Dg Lumbar Spine 2-3 Views  04/08/2013   CLINICAL DATA:  Lumbar fusion.  EXAM: LUMBAR SPINE - 2-3 VIEW; DG C-ARM 1-60 MIN  COMPARISON:  MRI lumbar spine 01/06/2013.  FINDINGS: We are provided with 3 fluoroscopic intraoperative spot views of lumbar spine. Images demonstrate pedicle screws and stabilization bars with interbody spacer at L2-3.  IMPRESSION: L2-3 fusion.   Electronically Signed   By: Drusilla Kanner M.D.   On: 04/08/2013 14:56   Dg C-arm 1-60 Min  04/08/2013   CLINICAL DATA:  Lumbar fusion.  EXAM: LUMBAR SPINE - 2-3 VIEW; DG C-ARM 1-60 MIN  COMPARISON:  MRI lumbar spine 01/06/2013.  FINDINGS: We are provided with 3 fluoroscopic intraoperative spot views of lumbar spine. Images demonstrate pedicle screws and stabilization bars with interbody spacer at L2-3.  IMPRESSION: L2-3 fusion.   Electronically Signed   By: Drusilla Kanner M.D.   On: 04/08/2013 14:56    Antibiotics:  Anti-infectives   Start     Dose/Rate Route Frequency Ordered Stop    04/08/13 1700  ceFAZolin (ANCEF) IVPB 1 g/50 mL premix     1 g 100 mL/hr over 30 Minutes Intravenous Every 8 hours 04/08/13 1559 04/09/13 0138   04/08/13 1022  bacitracin 50,000 Units in sodium chloride irrigation 0.9 % 500 mL irrigation  Status:  Discontinued       As needed 04/08/13 1022 04/08/13 1228   04/08/13 0600  ceFAZolin (ANCEF) 3 g in dextrose 5 % 50 mL IVPB     3 g 160 mL/hr over 30 Minutes Intravenous On call to O.R. 04/07/13 1425 04/08/13 0945      Discharge Exam: Blood pressure 128/79, pulse 70, temperature 98.7 F (37.1 C), temperature source Oral, resp. rate 16, SpO2 96.00%. Neurologic: Grossly normal Incision CDI  Discharge Medications:     Medication List         aspirin 81 MG tablet  Take 81 mg by mouth daily.     atorvastatin 40 MG tablet  Commonly known as:  LIPITOR  Take 40 mg by mouth daily.     doxazosin 8 MG tablet  Commonly known as:  CARDURA  Take 8 mg by mouth every morning.     enalapril 20 MG tablet  Commonly known as:  VASOTEC  Take 20 mg by mouth daily.     methocarbamol 500 MG tablet  Commonly known as:  ROBAXIN  Take 1 tablet (500 mg total) by mouth every 6 (six) hours as needed for muscle spasms.     oxyCODONE-acetaminophen 5-325 MG per tablet  Commonly known as:  PERCOCET/ROXICET  Take 1 tablet by mouth every 4 (four) hours as needed for moderate pain.        Disposition: home   Final Dx: PLIF L2-3      Discharge Orders   Future Appointments Provider Department Dept Phone   04/16/2013 10:00 AM [facility], [place] [phone]   Future Orders Complete By Expires   Call MD for:  difficulty breathing, headache or visual disturbances  As directed    Call MD for:  persistant nausea and vomiting  As directed    Call MD for:  redness, tenderness, or signs of infection (pain, swelling, redness, odor or green/yellow discharge around incision site)  As directed    Call MD for:  severe  uncontrolled pain  As directed    Call MD for:  temperature >100.4  As directed    Diet - low sodium heart healthy  As directed    Discharge instructions  As directed    Comments:     No bending, twisting, or heavy lifting, no driving   Increase activity slowly  As directed       Follow-up Information   Follow up with Oather Muilenburg S, MD. Schedule an appointment as soon as possible for a visit in 2 weeks.   Specialty:  Neurosurgery   Contact information:   [address] [place] [address] [phone]        Signed: Tia Alert 04/09/2013, 7:38 AM

## 2013-04-09 NOTE — Evaluation (Signed)
Physical Therapy Evaluation Patient Details Name: Philip [name] MRN: [id number] DOB: [birth year] Today's Date: 04/09/2013 Time: 0865-7846 PT Time Calculation (min): 14 min  PT Assessment / Plan / Recommendation History of Present Illness  Pt is s/p PLIF L2-3 on 04/08/13  Clinical Impression  Upbeat and pleased with surgical outcome. Pt currently ambulating at supervision level with RW, recommend pt utilize RW initially at home. Pt declines HHPT. Will continue to follow pt acutely to work towards modified independent goals. Pt good to D/C home from physical therapy standpoint once medically stable.     PT Assessment  Patient needs continued PT services    Follow Up Recommendations  Supervision for mobility/OOB (Pt declines HHPT followup)       Barriers to Discharge   None    Equipment Recommendations   (pt already has RW and cane at home)       Frequency Min 5X/week    Precautions / Restrictions Precautions Precautions: Back Precaution Booklet Issued: Yes (comment) Precaution Comments: Reviewed precautions, min cues needed with functional mobility Required Braces or Orthoses: Spinal Brace Spinal Brace: Applied in sitting position;Lumbar corset Restrictions Weight Bearing Restrictions: No   Pertinent Vitals/Pain No c/o pain. Pt very pleased with surgical outcomes      Mobility  Bed Mobility Bed Mobility: Sit to Supine Rolling Right: 5: Supervision Right Sidelying to Sit: 5: Supervision;HOB flat Sitting - Scoot to Edge of Bed: 6: Modified independent (Device/Increase time) Sit to Supine: 6: Modified independent (Device/Increase time) Details for Bed Mobility Assistance: Pt demonstrates good technique for log roll. Transfers Transfers: Sit to Stand;Stand to Sit Sit to Stand: 5: Supervision;From bed;With upper extremity assist Stand to Sit: To bed;To toilet;With upper extremity assist;5: Supervision Details for Transfer Assistance: Supervision level transfers w/  ocassional vc's for back precautions during transfers to bed Ambulation/Gait Ambulation/Gait Assistance: 5: Supervision Ambulation Distance (Feet): 150 Feet Assistive device: Rolling walker Ambulation/Gait Assistance Details: Lt. foot drop present but pt reports he typically wears his AFO on daily basis at home to compensate, reports no difficulties with tripping when wearing brace. Pt relient on RW at this time.  Gait Pattern: Step-through pattern;Decreased stride length (decreased foot clearance LT. more impaired than Rt.) Gait velocity: decreased Stairs: Yes Stairs Assistance: 4: Min guard;5: Supervision Stairs Assistance Details (indicate cue type and reason): min-guard progressing to supervision, cues to step down with Lt. first to allow Rt. LE to control descent (when stepping down with Rt. first decreased control of descent with Lt. LE and this jars pt).  Stair Management Technique: One rail Right;Step to pattern;Forwards Number of Stairs: 12 Wheelchair Mobility Wheelchair Mobility: No        PT Diagnosis: Difficulty walking;Abnormality of gait;Generalized weakness  PT Problem List: Decreased strength;Decreased activity tolerance;Decreased balance;Decreased mobility;Decreased knowledge of precautions;Decreased knowledge of use of DME PT Treatment Interventions: DME instruction;Gait training;Stair training;Functional mobility training;Therapeutic activities;Therapeutic exercise;Balance training;Neuromuscular re-education;Patient/family education     PT Goals(Current goals can be found in the care plan section) Acute Rehab PT Goals Patient Stated Goal: Go home later today w/ wife assist PRN PT Goal Formulation: With patient Time For Goal Achievement: 04/16/13 Potential to Achieve Goals: Good  Visit Information  Last PT Received On: 04/09/13 Assistance Needed: +1 History of Present Illness: Pt is s/p PLIF L2-3 on 04/08/13       Prior Functioning  Home Living Family/patient  expects to be discharged to:: Private residence Living Arrangements: Spouse/significant other Available Help at Discharge: Family Type of Home: House Home Access:  Stairs to enter [employer] of Steps: 4 Entrance Stairs-Rails: Right Home Layout: One level Home Equipment: Hand held shower head;Cane - single point;Walker - 2 wheels;Other (comment) (Carbon fiber toe-off brace) Prior Function Level of Independence: Independent with assistive device(s) Comments: Pt reports that he ambulates w/ cane and dorsiflexion assist AFO secondary to h/o left foot drop. Communication Communication: No difficulties Dominant Hand: Right    Cognition  Cognition Arousal/Alertness: Awake/alert Behavior During Therapy: WFL for tasks assessed/performed Overall Cognitive Status: Within Functional Limits for tasks assessed    Extremity/Trunk Assessment Upper Extremity Assessment Upper Extremity Assessment: Defer to OT evaluation Lower Extremity Assessment Lower Extremity Assessment: RLE deficits/detail RLE Deficits / Details: WFL LLE Deficits / Details: Left foot drop, ambulates w/ cane. Decreased eccentric control of Lt. quads when descending steps.   Balance Balance Balance Assessed: Yes Dynamic Standing Balance Dynamic Standing - Balance Support: Bilateral upper extremity supported Dynamic Standing - Level of Assistance: 5: Stand by assistance  End of Session PT - End of Session Equipment Utilized During Treatment: Gait belt;Back brace Activity Tolerance: Patient tolerated treatment well Patient left: in bed;with call bell/phone within reach (sitting set up with breakfast) Nurse Communication: Mobility status  GP     Wilhemina Bonito 04/09/2013, 9:08 AM

## 2013-04-09 NOTE — Progress Notes (Signed)
Pt given D/C instructions with Rx's, verbal understanding given. Pt D/C'd home via wheelchair with wife per MD order. Pt stable @ D/C, no other needs @ this time. Rema Fendt, RN

## 2013-04-13 ENCOUNTER — Encounter (HOSPITAL_COMMUNITY): Payer: Self-pay | Admitting: Neurological Surgery

## 2013-04-16 ENCOUNTER — Encounter (INDEPENDENT_AMBULATORY_CARE_PROVIDER_SITE_OTHER): Payer: Medicare Other

## 2013-05-07 ENCOUNTER — Encounter (INDEPENDENT_AMBULATORY_CARE_PROVIDER_SITE_OTHER): Payer: Medicare Other

## 2013-06-11 ENCOUNTER — Ambulatory Visit (INDEPENDENT_AMBULATORY_CARE_PROVIDER_SITE_OTHER): Payer: Medicare Other | Admitting: Physician Assistant

## 2013-06-11 ENCOUNTER — Encounter (INDEPENDENT_AMBULATORY_CARE_PROVIDER_SITE_OTHER): Payer: Self-pay

## 2013-06-11 VITALS — BP 150/88 | HR 72 | Temp 98.5°F | Resp 14 | Ht 68.0 in | Wt 275.4 lb

## 2013-06-11 DIAGNOSIS — Z9884 Bariatric surgery status: Secondary | ICD-10-CM

## 2013-06-11 NOTE — Patient Instructions (Signed)
Return in four to five months. Focus on good food choices as well as physical activity. Return sooner if you have an increase in hunger, portion sizes or weight. Return also for difficulty swallowing, night cough, reflux.

## 2013-06-11 NOTE — Progress Notes (Signed)
  HISTORY: Philip [name] is a 68 y.o.male who received an AP-Large lap-band in June 2009 by Dr. Johna SheriffHoxworth. He comes in with 7 lbs weight gain since his last adjustment in August. Fluid was removed due to persistent solid food dysphagia. He has lost 68 lbs since his surgery. He denies regurgitation or reflux symptoms. His wife recently underwent RYGB and has lost a significant amount of weight. He has used this to gauge his own success with his weight loss journey and he is disappointed. Overall, he thinks that the band is properly adjusted but his food choices have interfered with his success. He also reports boredom since retirement, so boredom eating is a large factor keeping him from being successful. He underwent spine surgery in November and short of stationary bicycling, he won't be able to exercise until March at the earliest.  VITAL SIGNS: Filed Vitals:   06/11/13 0953  BP: 150/88  Pulse: 72  Temp: 98.5 F (36.9 C)  Resp: 14    PHYSICAL EXAM: Physical exam reveals a very well-appearing 67 y.o.male in no apparent distress Neurologic: Awake, alert, oriented Psych: Bright affect, conversant Respiratory: Breathing even and unlabored. No stridor or wheezing Extremities: Atraumatic, good range of motion. Skin: Warm, Dry, no rashes Musculoskeletal: Normal gait, Joints normal  ASSESMENT: 68 y.o.  male  s/p AP-large lap-band.   PLAN: We discussed adjusting the band to see if this would aid his weight loss but he thought this would cause more trouble. He seems aware of the things that hinder his success, which are mostly behavioral. I encouraged him to investigate behavioral health professionals to guide him but he didn't have much enthusiasm for this approach. He asked about conversion to RYGB, but I encouraged him to 'max out' his opportunities with the band before going in that direction. As such, we're setting him up to see Dr. Johna SheriffHoxworth in the next 4-5 months to review his progress and  discuss future considerations.

## 2013-08-25 ENCOUNTER — Telehealth: Payer: Self-pay | Admitting: Gastroenterology

## 2013-08-25 NOTE — Telephone Encounter (Signed)
Spoke with patient and he does not have a preference for new GI MD. Scheduled patient with Mike GipAmy Esterwood, PA on 08/27/13 at 1:30 PM.

## 2013-08-27 ENCOUNTER — Ambulatory Visit: Payer: Medicare Other | Admitting: Physician Assistant

## 2013-09-02 ENCOUNTER — Ambulatory Visit (INDEPENDENT_AMBULATORY_CARE_PROVIDER_SITE_OTHER): Payer: Medicare Other | Admitting: Physician Assistant

## 2013-09-02 ENCOUNTER — Encounter: Payer: Self-pay | Admitting: Physician Assistant

## 2013-09-02 VITALS — BP 168/68 | HR 68 | Ht 67.75 in | Wt 285.2 lb

## 2013-09-02 DIAGNOSIS — I10 Essential (primary) hypertension: Secondary | ICD-10-CM

## 2013-09-02 DIAGNOSIS — Z9884 Bariatric surgery status: Secondary | ICD-10-CM | POA: Insufficient documentation

## 2013-09-02 DIAGNOSIS — R1310 Dysphagia, unspecified: Secondary | ICD-10-CM

## 2013-09-02 MED ORDER — PANTOPRAZOLE SODIUM 40 MG PO TBEC: 40.0000 mg | DELAYED_RELEASE_TABLET | Freq: Every day | ORAL | Status: DC

## 2013-09-02 NOTE — Progress Notes (Signed)
Reviewed and agree with management plan.  Tavone Caesar T. Hassen Bruun, MD FACG 

## 2013-09-02 NOTE — Patient Instructions (Addendum)
We sent ref[facility] to [facility] for Pantoprazole sodium. We have scheduled the Barium Swallow test and Upper GI [facility]. Go to patient registration insde front door of the hospital. Arrive at 9:15 tomorrow, Thursday 4-9. Have nothing by mouth after midnight. You have been scheduled for an endoscopy with propofol. Please follow written instructions given to you at your visit today. If you use inhalers (even only as needed), please bring them with you on the day of your procedure. Your physician has requested that you go to www.startemmi.com and enter the access code given to you at your visit today. This web site gives a general overview about your procedure. However, you should still follow specific instructions given to you by our office regarding your preparation for the procedure.

## 2013-09-02 NOTE — Progress Notes (Signed)
Subjective:    Patient ID: Philip [name], male    DOB: 09-26-1945, 68 y.o.   MRN: [id number]  HPI  Philip [name] is a pleasant 65 H. or old white male previously known to Dr. Jarold Motto. He has undergone prior colonoscopies. He does have history of adenomatous polyps however on last colonoscopy done in March of 2013 he had a normal exam with no polyps. He was recommended for 10 year interval followup. Patient has history of morbid obesity and had undergone a lap banding or Dr. Johna Sheriff in 2009. He initially lost weight and then over the past couple years had started gradually gaining weight. He has had some of the fluid removed from his port because of complaints of vague dysphagia. He hasn't noticed any change since then. He says he's been having trouble over at least the past year to a year and a half . He says that he is basically stopped eating most meats because of difficulty swallowing. He says he has to eat very slowly and  Says yesterday it  took him about an hour and a half to eat a meal of beans and rice. He says he feels as if food is sitting in his esophagus for a  prolonged period of time unless he eats something extremely soft. He says he frequently has to stop eating to allow the food pass but generally does not have to regurgitate. He has no complaints of heartburn or indigestion no abnormal pain or distention. He does have difficulty with pills as well as specially if he takes several pills at one time and says he can't drink carbonated drinks because it "bubbles back up".    Review of Systems  Constitutional: Negative.   HENT: Positive for trouble swallowing.   Eyes: Negative.   Respiratory: Negative.   Cardiovascular: Negative.   Gastrointestinal: Negative.   Endocrine: Negative.   Genitourinary: Negative.   Musculoskeletal: Negative.   Allergic/Immunologic: Negative.   Neurological: Negative.   Hematological: Negative.   Psychiatric/Behavioral: Negative.    Outpatient  Prescriptions Prior to Visit  Medication Sig Dispense Refill  . atorvastatin (LIPITOR) 40 MG tablet Take 40 mg by mouth daily.      Marland Kitchen doxazosin (CARDURA) 8 MG tablet Take 8 mg by mouth every morning.       . enalapril (VASOTEC) 20 MG tablet Take 20 mg by mouth daily.      . methocarbamol (ROBAXIN) 500 MG tablet Take 1 tablet (500 mg total) by mouth every 6 (six) hours as needed for muscle spasms.  60 tablet  1  . oxyCODONE-acetaminophen (PERCOCET/ROXICET) 5-325 MG per tablet Take 1 tablet by mouth every 4 (four) hours as needed for moderate pain.  60 tablet  0  . aspirin 81 MG tablet Take 81 mg by mouth daily.       No facility-administered medications prior to visit.   No Known Allergies Patient Active Problem List   Diagnosis Date Noted  . Morbid obesity 09/02/2013  . H/O laparoscopic adjustable gastric banding 09/02/2013  . HTN (hypertension) 09/02/2013   History  Substance Use Topics  . Smoking status: Former Smoker -- 1.00 packs/day for 10 years    Types: Cigarettes    Quit date: 03/01/1975  . Smokeless tobacco: Never Used  . Alcohol Use: Yes     Comment: 3-4 drinks a year   family history includes Alzheimer's disease in his father, mother, and other; Heart disease in his father; Lung cancer in his maternal aunt. There is  no history of Colon cancer.     Objective:   Physical Exam  well-developed older white male in no acute distress, pleasant blood pressure 168/68 pulse 68 height 5 foot 7 weight 285, BMI 43.6. HEENT; nontraumatic normocephalic EOMI PERRLA sclera anicteric, Neck;Supple no JVD, Cardiovascular; regular rate and rhythm with S1-S2 he does have a soft systolic murmur, Pulmonary ;clear bilaterally, Abdomen; obese soft nontender nondistended bowel sounds are active there is no palpable mass or hepatosplenomegaly he does have a palpable port from his lap and in the right hypogastrium, Rectal; exam not done, Ext; no clubbing cyanosis or edema skin warm and dry, Psych; mood  and affect appropriate        Assessment & Plan:  #481  68 year old male with 1-1/2 year history of somewhat progressive dysphagia, primarily to solids. This is in the setting of a lap band which was placed in 2009. Rule out peptic esophageal stricture, rule out motility disorder rule out symptoms secondary to lap band  #2 morbid obesity #3 hypertension #4 history of adenomatous colon polyps, last colonoscopy 2013 negative-and recommended for 10 year interval followup.  Plan; patient had recently been started on Protonix 40 mg by mouth every morning, will continue for now Schedule for barium swallow and upper GI Will schedule for EGD with Savary dilation if indicated with Dr. Russella DarStark. This can be canceled if no stricture seen on barium swallow. Procedure was discussed in detail with the patient and he is agreeable to proceed In the interim he is advised to avoid meats and to his foods very carefully

## 2013-09-03 ENCOUNTER — Ambulatory Visit (HOSPITAL_COMMUNITY)
Admission: RE | Admit: 2013-09-03 | Discharge: 2013-09-03 | Disposition: A | Discharge status: Home / Self Care | Payer: Medicare Other | Source: Ambulatory Visit | Attending: Physician Assistant | Admitting: Physician Assistant

## 2013-09-03 ENCOUNTER — Telehealth (INDEPENDENT_AMBULATORY_CARE_PROVIDER_SITE_OTHER): Payer: Self-pay | Admitting: General Surgery

## 2013-09-03 DIAGNOSIS — K449 Diaphragmatic hernia without obstruction or gangrene: Secondary | ICD-10-CM | POA: Insufficient documentation

## 2013-09-03 DIAGNOSIS — R1310 Dysphagia, unspecified: Secondary | ICD-10-CM

## 2013-09-03 DIAGNOSIS — K224 Dyskinesia of esophagus: Secondary | ICD-10-CM | POA: Insufficient documentation

## 2013-09-03 DIAGNOSIS — Z9884 Bariatric surgery status: Secondary | ICD-10-CM | POA: Insufficient documentation

## 2013-09-03 NOTE — Telephone Encounter (Signed)
Patient called in explaining that he just got done with his UGI and the radiologist explained that he has a slipped band and a hiatal hernia.  Informed him that this report is not completed yet and that I am unable to see it.  He was instructed to call and make an appt w/ Dr. Johna SheriffHoxworth.  I informed the patient that I would have to get in touch with his nurse to be able to work him into the schedule and that we would give him a call back.  The patient was in agreance with this. Explained that once we get this report, this will also help us determine how soon we need to make his appt. He explained he can keep liquids and a soft diet down but that it is only about a tablespoon worth of food.

## 2013-09-03 NOTE — Telephone Encounter (Signed)
Called and spoke to patient regarding UGI.  Patient scheduled to see Dr. Johna SheriffHoxworth tomorrow 09/04/13 @ 4:15pm.

## 2013-09-04 ENCOUNTER — Encounter (INDEPENDENT_AMBULATORY_CARE_PROVIDER_SITE_OTHER): Payer: Self-pay | Admitting: General Surgery

## 2013-09-04 ENCOUNTER — Ambulatory Visit (INDEPENDENT_AMBULATORY_CARE_PROVIDER_SITE_OTHER): Payer: Medicare Other | Admitting: General Surgery

## 2013-09-04 DIAGNOSIS — Z9884 Bariatric surgery status: Secondary | ICD-10-CM

## 2013-09-04 NOTE — Progress Notes (Signed)
Subjective:   obesity, dysphagia, lap band status  Patient ID: Philip [name], male   DOB: 1946-04-05, 68 y.o.   MRN: [id number]  HPI Patient returns to the office well-known to me status post lap band placement in June of 2009. At that time he weighed 343 pounds with comorbidities of hypertension cholesterol sleep apnea and degenerative joint disease. The patient initially did quite well losing almost 100 pounds as of about 3 years ago. However he has had some weight regain particularly in the last year. He has had some worsening dysphagia which has required fluid to be removed from his band on a couple of occasions. He continues to have dysphagia. He describes food sticking almost immediately when he swallows in his upper chest or throat. Despite this he is grazing somewhat and eating more and has gained about 30 pounds. He recently was evaluated by GI and an upper GI series was obtained which I have reviewed. This shows fairly marked esophageal dysmotility and tertiary contractions. His band is in the normal orientation and there is no evidence of slip. There is a small hiatal hernia with a small gastric pouch herniated above the diaphragm. The patient is very distressed about recent weight gain and is concerned about his long-term health with the weight gain. His joint pain was significantly better when he was down almost 100 pounds and is still better than preop band but he feels that his joint pain and mobility issues are returning as the weight comes back up.  Review of Systems General: Positive for weight gain and fatigue but no fever chills or malaise Respiratory: No shortness of breath cough wheezing cardiac: No chest pain palpitations leg swelling  Abdominal/GI: As above GU: Negative Muscular skeletal: Some chronic back and lower extremity pain    Objective:   Physical Exam General: Alert, obese Caucasian male, in no distress Skin: Warm and dry without rash or infection. HEENT: No  palpable masses or thyromegaly. Sclera nonicteric. Pupils equal round and reactive. Oropharynx clear. Lymph nodes: No cervical, supraclavicular, or inguinal nodes palpable. Lungs: Breath sounds clear and equal without increased work of breathing Cardiovascular: Regular rate and rhythm without murmur. No JVD or edema. Peripheral pulses intact. Abdomen: Nondistended. Soft and nontender. Port site unremarkable.No masses palpable. No organomegaly. No palpable hernias. Extremities: No edema or joint swelling or deformity. No chronic venous stasis changes. Neurologic: Alert and fully oriented. Gait normal.    Assessment:     Status post lap band 2009 for morbid obesity. He had initially excellent weight loss and has maintained some weight loss and resolution of sleep apnea and improvement of hypertension and joint pain. He now however has a hiatal hernia involving a small gastric pouch. No evidence of slipped. He has significant esophageal motility. I had a long discussion with the patient and his wife regarding options. We could repair his hiatal hernia but concerned that with his esophageal motility he will continue to have dysphagia with his lap band in place. I think a much more definitive correction in terms of weight loss and prevention of further dysphagia would be to remove his lap band, repaired a small hiatal hernia if technically feasible and converted to gastric bypass. I discussed this procedure in detail with the patient and his wife and they of course understand that she has had a gastric bypass. We discussed the nature of the surgery and recovery and risks of anesthetic complications, bleeding, leakage and infection, blood clots and pulmonary emboli in long-term risks  of nutritional deficiencies, ulceration, stenosis, internal hernias. They were given a complete consent form to review. We will have him see a nutritionist for education for gastric bypass and check for H. Pylori. Do not feel any  other workup is indicated.    Plan:     Begin evaluation and planning for conversion of lap band to gastric bypass with possible hiatal hernia repair.

## 2013-09-07 LAB — H. PYLORI ANTIBODY, IGG

## 2013-09-09 ENCOUNTER — Ambulatory Visit (HOSPITAL_COMMUNITY)
Admission: RE | Admit: 2013-09-09 | Discharge: 2013-09-09 | Disposition: A | Discharge status: Home / Self Care | Payer: Medicare Other | Source: Ambulatory Visit | Attending: General Surgery | Admitting: General Surgery

## 2013-09-09 ENCOUNTER — Encounter: Payer: Self-pay | Admitting: *Deleted

## 2013-09-09 ENCOUNTER — Telehealth (INDEPENDENT_AMBULATORY_CARE_PROVIDER_SITE_OTHER): Payer: Self-pay | Admitting: General Surgery

## 2013-09-09 ENCOUNTER — Other Ambulatory Visit: Payer: Self-pay | Admitting: *Deleted

## 2013-09-09 DIAGNOSIS — I10 Essential (primary) hypertension: Secondary | ICD-10-CM | POA: Insufficient documentation

## 2013-09-09 DIAGNOSIS — K449 Diaphragmatic hernia without obstruction or gangrene: Secondary | ICD-10-CM | POA: Insufficient documentation

## 2013-09-09 DIAGNOSIS — M2550 Pain in unspecified joint: Secondary | ICD-10-CM | POA: Insufficient documentation

## 2013-09-09 DIAGNOSIS — R1310 Dysphagia, unspecified: Secondary | ICD-10-CM | POA: Insufficient documentation

## 2013-09-09 DIAGNOSIS — I517 Cardiomegaly: Secondary | ICD-10-CM | POA: Insufficient documentation

## 2013-09-09 DIAGNOSIS — Z6841 Body Mass Index (BMI) 40.0 and over, adult: Secondary | ICD-10-CM | POA: Insufficient documentation

## 2013-09-09 DIAGNOSIS — K224 Dyskinesia of esophagus: Secondary | ICD-10-CM

## 2013-09-09 DIAGNOSIS — G4730 Sleep apnea, unspecified: Secondary | ICD-10-CM | POA: Insufficient documentation

## 2013-09-09 DIAGNOSIS — Z9884 Bariatric surgery status: Secondary | ICD-10-CM

## 2013-09-09 DIAGNOSIS — M549 Dorsalgia, unspecified: Secondary | ICD-10-CM | POA: Insufficient documentation

## 2013-09-09 NOTE — Telephone Encounter (Signed)
Pt of Dr. Johna SheriffHoxworth called to ask about referral appt for upper endoscopy.  He was seen by [facility] and had UGI.  They are now scheduling him for an upper endoscopy, but he wants Dr. Jamse MeadHoxworth's input on the necessity of it.  He is on their schedule for Monday, September 21, 2013.  Please advise pt.

## 2013-09-10 ENCOUNTER — Encounter: Payer: Self-pay | Admitting: Gastroenterology

## 2013-09-10 ENCOUNTER — Encounter: Payer: Self-pay | Admitting: Internal Medicine

## 2013-09-15 ENCOUNTER — Telehealth (INDEPENDENT_AMBULATORY_CARE_PROVIDER_SITE_OTHER): Payer: Self-pay | Admitting: General Surgery

## 2013-09-15 NOTE — Telephone Encounter (Signed)
Call the patient in left message regarding esophageal manometry recommendation

## 2013-09-16 ENCOUNTER — Telehealth: Payer: Self-pay | Admitting: *Deleted

## 2013-09-16 NOTE — Telephone Encounter (Signed)
Cancelled procedure and OV. Called and left a message on patient's answering machine that these appointments were cancelled as per Dr. Rhea BeltonPyrtle and Dr. Johna SheriffHoxworth.

## 2013-09-16 NOTE — Telephone Encounter (Signed)
Message copied by Daphine DeutscherMILLER, Augusta Hilbert N on Wed Sep 16, 2013 10:12 AM ------      Message from: Beverley FiedlerPYRTLE, JAY M      Created: Wed Sep 16, 2013  9:40 AM       Creed CopperHey Timea Breed      See below.      Dr. Johna SheriffHoxworth does not think this pt needs mano, so this can be canceled, along with his appt with me      JMP            ----- Message -----         From: Mariella SaaBenjamin T Hoxworth, MD         Sent: 09/15/2013   3:05 PM           To: Beverley FiedlerJay M Pyrtle, MD            I saw Philip NeedleMichael in the office recently. We are planning to proceed with revisional bariatric surgery with removal of his lap band and conversion to Roux-en-Y gastric bypass. He should do fine with this even with significant esophageal dysmotility and therefore I told him I did not think that esophageal manometry would be necessary preoperatively. Please call me if you have any concerns/questions. Sincerely, been       ------

## 2013-09-21 ENCOUNTER — Encounter (HOSPITAL_COMMUNITY): Admission: RE | Payer: Self-pay | Source: Ambulatory Visit

## 2013-09-21 ENCOUNTER — Ambulatory Visit (HOSPITAL_COMMUNITY): Admission: RE | Admit: 2013-09-21 | Payer: Medicare Other | Source: Ambulatory Visit | Admitting: Gastroenterology

## 2013-09-21 SURGERY — MANOMETRY, ESOPHAGUS

## 2013-09-25 ENCOUNTER — Encounter: Payer: Self-pay | Admitting: Dietician

## 2013-09-25 ENCOUNTER — Encounter: Payer: Medicare Other | Attending: General Surgery | Admitting: Dietician

## 2013-09-25 DIAGNOSIS — Z713 Dietary counseling and surveillance: Secondary | ICD-10-CM | POA: Insufficient documentation

## 2013-09-25 NOTE — Progress Notes (Signed)
  Pre-Op Assessment Visit:  Pre-Operative RYGB Surgery  Medical Nutrition Therapy:  Appt start time: 0930   End time:  1000.  Patient was seen on 09/25/2013 for Pre-Operative RYGB Nutrition Assessment. Assessment and letter of approval [facility] coordinator on 09/25/2013.   Preferred Learning Style:  No preference indicated   Learning Readiness:   Ready  Handouts given during visit include:  Pre-Op Goals Bariatric Surgery Protein Shakes  Teaching Method Utilized:  Visual Auditory  Barriers to learning/adherence to lifestyle change: none  Demonstrated degree of understanding via:  Teach Back   Patient to call the [facility] to enroll in Pre-Op and Post-Op Nutrition Education when surgery date is scheduled.

## 2013-10-02 ENCOUNTER — Encounter: Payer: Medicare Other | Admitting: Gastroenterology

## 2013-10-05 ENCOUNTER — Ambulatory Visit: Payer: Medicare Other | Admitting: Gastroenterology

## 2013-10-07 ENCOUNTER — Ambulatory Visit: Payer: Medicare Other | Admitting: Gastroenterology

## 2013-10-13 ENCOUNTER — Ambulatory Visit: Payer: Medicare Other | Admitting: Dietician

## 2013-10-23 ENCOUNTER — Ambulatory Visit (HOSPITAL_COMMUNITY): Payer: Medicare Other

## 2013-10-29 ENCOUNTER — Ambulatory Visit: Payer: Medicare Other | Admitting: Internal Medicine

## 2013-12-15 ENCOUNTER — Telehealth (INDEPENDENT_AMBULATORY_CARE_PROVIDER_SITE_OTHER): Payer: Self-pay

## 2013-12-15 NOTE — Telephone Encounter (Signed)
Message copied by Maryan PulsMOORE, Johnel Yielding on Tue Dec 15, 2013 11:07 AM ------      Message from: Louie CasaBARAJAS, RUTH      Created: Fri Dec 11, 2013  1:07 PM      Regarding: DR. Blaine HamperHOXWORTH/GASTRIC BYPASS      Contact: [phone]: [phone]       Pt's wife Naomie DeanLinda Romito called and wants to speak with you and ask you some questions about his husband.            She did not want to left more messages to [facility], please call her.            Thank you.             ------

## 2013-12-15 NOTE — Telephone Encounter (Signed)
Patient calling with questions checking the status of his Conversion of Lap Band to Gastric By-pass with possible Hiatal Hernia Repair.  Patient reports that he spoke to our office and was told he would need 6 months supervised weight loss before he could have the Conversion to By-Pass.  Patient was last seen 09/04/13.  Patient had lap band placed in 11/25/2007.  Patient uncertain of why he would need to have 6 months supervised weight loss since he already has a lap band.

## 2013-12-15 NOTE — Telephone Encounter (Signed)
This is purely an insurance requirement and has nothing to do with my recommendations.

## 2014-01-18 ENCOUNTER — Encounter: Payer: Medicare Other | Attending: General Surgery

## 2014-01-18 DIAGNOSIS — Z713 Dietary counseling and surveillance: Secondary | ICD-10-CM | POA: Insufficient documentation

## 2014-01-18 DIAGNOSIS — Z6841 Body Mass Index (BMI) 40.0 and over, adult: Secondary | ICD-10-CM | POA: Diagnosis not present

## 2014-01-22 NOTE — Progress Notes (Signed)
Need orders in epic for 02-08-14 surgery, pre op 01-29-14 1100 am

## 2014-01-25 NOTE — Progress Notes (Signed)
  Pre-Operative Nutrition Class:  Appt start time: 3893   End time:  1830.  Patient was seen on 01/18/2014 for Pre-Operative Bariatric Surgery Education at the [facility].   Surgery date: 02/08/2014 Surgery type: RYGB Start weight at [facility]: 286.5 lbs on 09/25/2013 Weight today: 278.5 lbs  TANITA  BODY COMP RESULTS  01/25/14   BMI (kg/m^2) 42.3   Fat Mass (lbs) 123   Fat Free Mass (lbs) 155.5   Total Body Water (lbs) 114   Samples given per MNT protocol. Patient educated on appropriate usage:  Premier protein shake (vanilla - qty 1)  Lot #: [id number]  Exp: 02/2014   PB2 (plain - qty 1)  Lot #: [id number]  Exp: 01/13/2015   Celebrate Vitamins Multivitamin (pineapple strawberry - qty 1)  Lot #: [id number]  Exp: 08/2014   Renee Pain Protein Powder (unflavored - qty 1)  Lot #: [id number]  Exp: 03/2015  The following the learning objectives were met by the patient during this course:  Identify Pre-Op Dietary Goals and will begin 2 weeks pre-operatively  Identify appropriate sources of fluids and proteins   State protein recommendations and appropriate sources pre and post-operatively  Identify Post-Operative Dietary Goals and will follow for 2 weeks post-operatively  Identify appropriate multivitamin and calcium sources  Describe the need for physical activity post-operatively and will follow MD recommendations  State when to call healthcare provider regarding medication questions or post-operative complications  Handouts given during class include:  Pre-Op Bariatric Surgery Diet Handout  Protein Shake Handout  Post-Op Bariatric Surgery Nutrition Handout  BEL[facility] Program Information Flyer  Support Group Information Flyer  [facility] Bariatric Supplements Price List  Follow-Up Plan: Patient will follow-up at [facility] 2 weeks post operatively for diet advancement per MD.

## 2014-01-26 ENCOUNTER — Encounter (HOSPITAL_COMMUNITY): Payer: Self-pay | Admitting: Pharmacy Technician

## 2014-01-27 NOTE — Patient Instructions (Addendum)
Philip [name]  01/27/2014                           YOUR PROCEDURE IS SCHEDULED ON:  02/08/14               ENTER THRU [facility] ENTRANCE AND                            FOLLOW  SIGNS TO SHORT STAY CENTER                 ARRIVE AT SHORT STAY AT:  5:15 AM               CALL THIS NUMBER IF ANY PROBLEMS THE DAY OF SURGERY :               [phone]                                REMEMBER:   Do not eat food or drink liquids AFTER MIDNIGHT                  Take these medicines the morning of surgery with               A SIPS OF WATER :   AMLODIPINE / ATORAVASTIN / PANTOPRAZOLE      Do not wear jewelry, make-up   Do not wear lotions, powders, or perfumes.   Do not shave legs or underarms 12 hrs. before surgery (men may shave face)  Do not bring valuables to the hospital.  Contacts, dentures or bridgework may not be worn into surgery.  Leave suitcase in the car. After surgery it may be brought to your room.  For patients admitted to the hospital more than one night, checkout time is            11:00 AM                                                         ________________________________________________________________________                                                    [place] - PREPARING FOR SURGERY  Before surgery, you can play an important role.  Because skin is not sterile, your skin needs to be as free of germs as possible.  You can reduce the number of germs on your skin by washing with CHG (chlorahexidine gluconate) soap before surgery.  CHG is an antiseptic cleaner which kills germs and bonds with the skin to continue killing germs even after washing. Please DO NOT use if you have an allergy to CHG or antibacterial soaps.  If your skin becomes reddened/irritated stop using the CHG and inform your nurse when you arrive at Short Stay. Do not shave (including legs and underarms) for at least 48 hours prior to the first CHG shower.  You  may shave your face. Please follow these instructions carefully:   1.  Shower with CHG  Soap the night before surgery and the  morning of Surgery.   2.  If you choose to wash your hair, wash your hair first as usual with your  normal  Shampoo.   3.  After you shampoo, rinse your hair and body thoroughly to remove the  shampoo.                                         4.  Use CHG as you would any other liquid soap.  You can apply chg directly  to the skin and wash . Gently wash with scrungie or clean wascloth    5.  Apply the CHG Soap to your body ONLY FROM THE NECK DOWN.   Do not use on open                           Wound or open sores. Avoid contact with eyes, ears mouth and genitals (private parts).                        Genitals (private parts) with your normal soap.              6.  Wash thoroughly, paying special attention to the area where your surgery  will be performed.   7.  Thoroughly rinse your body with warm water from the neck down.   8.  DO NOT shower/wash with your normal soap after using and rinsing off  the CHG Soap .                9.  Pat yourself dry with a clean towel.             10.  Wear clean pajamas.             11.  Place clean sheets on your bed the night of your first shower and do not  sleep with pets.  Day of Surgery : Do not apply any lotions/deodorants the morning of surgery.  Please wear clean clothes to the hospital/surgery center.  FAILURE TO FOLLOW THESE INSTRUCTIONS MAY RESULT IN THE CANCELLATION OF YOUR SURGERY    PATIENT SIGNATURE_________________________________  ______________________________________________________________________

## 2014-01-29 ENCOUNTER — Encounter (INDEPENDENT_AMBULATORY_CARE_PROVIDER_SITE_OTHER): Payer: Self-pay

## 2014-01-29 ENCOUNTER — Encounter (HOSPITAL_COMMUNITY): Payer: Self-pay

## 2014-01-29 ENCOUNTER — Other Ambulatory Visit (INDEPENDENT_AMBULATORY_CARE_PROVIDER_SITE_OTHER): Payer: Self-pay | Admitting: General Surgery

## 2014-01-29 ENCOUNTER — Encounter (HOSPITAL_COMMUNITY)
Admission: RE | Admit: 2014-01-29 | Discharge: 2014-01-29 | Disposition: A | Discharge status: Home / Self Care | Payer: Medicare Other | Source: Ambulatory Visit | Attending: General Surgery | Admitting: General Surgery

## 2014-01-29 DIAGNOSIS — K224 Dyskinesia of esophagus: Secondary | ICD-10-CM | POA: Diagnosis not present

## 2014-01-29 DIAGNOSIS — Z6841 Body Mass Index (BMI) 40.0 and over, adult: Secondary | ICD-10-CM | POA: Diagnosis not present

## 2014-01-29 DIAGNOSIS — Z4651 Encounter for fitting and adjustment of gastric lap band: Secondary | ICD-10-CM | POA: Insufficient documentation

## 2014-01-29 DIAGNOSIS — Z01818 Encounter for other preprocedural examination: Secondary | ICD-10-CM | POA: Diagnosis present

## 2014-01-29 DIAGNOSIS — Z9884 Bariatric surgery status: Secondary | ICD-10-CM | POA: Diagnosis not present

## 2014-01-29 HISTORY — DX: Other complications of anesthesia, initial encounter: T88.59XA

## 2014-01-29 HISTORY — DX: Adverse effect of unspecified anesthetic, initial encounter: T41.45XA

## 2014-01-29 HISTORY — DX: Personal history of other diseases of the digestive system: Z87.19

## 2014-01-29 HISTORY — DX: Gastro-esophageal reflux disease without esophagitis: K21.9

## 2014-01-29 NOTE — Progress Notes (Signed)
01/29/14 1129  OBSTRUCTIVE SLEEP APNEA  Have you ever been diagnosed with sleep apnea through a sleep study? No (HX SLEEP APNEA  - HAD WT LOSS - NO LONGER USES C PAP)  Do you snore loudly (loud enough to be heard through closed doors)?  0  Do you often feel tired, fatigued, or sleepy during the daytime? 0  Has anyone observed you stop breathing during your sleep? 0  Do you have, or are you being treated for high blood pressure? 1  BMI more than 35 kg/m2? 1  Age over 58 years old? 1  Neck circumference greater than 40 cm/16 inches? 1  Gender: 1  Obstructive Sleep Apnea Score 5  Score 4 or greater  Results sent to PCP

## 2014-02-04 NOTE — Progress Notes (Signed)
CBC / CMET done 01/22/14 received for Dr. Johnathan Hausen office put into chart

## 2014-02-07 NOTE — Anesthesia Preprocedure Evaluation (Addendum)
Anesthesia Evaluation  Patient identified by MRN, date of birth, ID band Patient awake    Reviewed: Allergy & Precautions, H&P , NPO status , Patient's Chart, lab work & pertinent test results  Airway Mallampati: II TM Distance: >3 FB Neck ROM: Full    Dental no notable dental hx.    Pulmonary former smoker,  breath sounds clear to auscultation  Pulmonary exam normal       Cardiovascular hypertension, Pt. on medications Rhythm:Regular Rate:Normal     Neuro/Psych negative neurological ROS  negative psych ROS   GI/Hepatic Neg liver ROS, hiatal hernia, GERD-  Medicated,  Endo/Other  Morbid obesity  Renal/GU negative Renal ROS     Musculoskeletal  (+) Arthritis -,   Abdominal   Peds  Hematology negative hematology ROS (+)   Anesthesia Other Findings   Reproductive/Obstetrics negative OB ROS                          Anesthesia Physical Anesthesia Plan  ASA: III  Anesthesia Plan: General   Post-op Pain Management:    Induction: Intravenous  Airway Management Planned: Oral ETT  Additional Equipment:   Intra-op Plan:   Post-operative Plan: Extubation in OR  Informed Consent: I have reviewed the patients History and Physical, chart, labs and discussed the procedure including the risks, benefits and alternatives for the proposed anesthesia with the patient or authorized representative who has indicated his/her understanding and acceptance.   Dental advisory given  Plan Discussed with: CRNA  Anesthesia Plan Comments:         Anesthesia Quick Evaluation

## 2014-02-08 ENCOUNTER — Encounter (HOSPITAL_COMMUNITY): Payer: Self-pay | Admitting: *Deleted

## 2014-02-08 ENCOUNTER — Encounter (HOSPITAL_COMMUNITY)
Admission: RE | Disposition: A | Discharge status: Home / Self Care | Payer: Self-pay | Source: Ambulatory Visit | Attending: General Surgery

## 2014-02-08 ENCOUNTER — Inpatient Hospital Stay (HOSPITAL_COMMUNITY): Payer: Medicare Other | Admitting: Anesthesiology

## 2014-02-08 ENCOUNTER — Inpatient Hospital Stay (HOSPITAL_COMMUNITY)
Admission: RE | Admit: 2014-02-08 | Discharge: 2014-02-10 | DRG: 620 | Disposition: A | Discharge status: Home / Self Care | Payer: Medicare Other | Source: Ambulatory Visit | Attending: General Surgery | Admitting: General Surgery

## 2014-02-08 ENCOUNTER — Encounter (HOSPITAL_COMMUNITY): Payer: Medicare Other | Admitting: Anesthesiology

## 2014-02-08 DIAGNOSIS — Z801 Family history of malignant neoplasm of trachea, bronchus and lung: Secondary | ICD-10-CM | POA: Diagnosis not present

## 2014-02-08 DIAGNOSIS — K224 Dyskinesia of esophagus: Secondary | ICD-10-CM | POA: Diagnosis present

## 2014-02-08 DIAGNOSIS — R634 Abnormal weight loss: Secondary | ICD-10-CM | POA: Diagnosis present

## 2014-02-08 DIAGNOSIS — K449 Diaphragmatic hernia without obstruction or gangrene: Secondary | ICD-10-CM | POA: Diagnosis present

## 2014-02-08 DIAGNOSIS — Y838 Other surgical procedures as the cause of abnormal reaction of the patient, or of later complication, without mention of misadventure at the time of the procedure: Secondary | ICD-10-CM | POA: Diagnosis not present

## 2014-02-08 DIAGNOSIS — K219 Gastro-esophageal reflux disease without esophagitis: Secondary | ICD-10-CM | POA: Diagnosis present

## 2014-02-08 DIAGNOSIS — Z79899 Other long term (current) drug therapy: Secondary | ICD-10-CM | POA: Diagnosis not present

## 2014-02-08 DIAGNOSIS — Z96659 Presence of unspecified artificial knee joint: Secondary | ICD-10-CM

## 2014-02-08 DIAGNOSIS — G4733 Obstructive sleep apnea (adult) (pediatric): Secondary | ICD-10-CM | POA: Diagnosis present

## 2014-02-08 DIAGNOSIS — Z860100 Personal history of colon polyps, unspecified: Secondary | ICD-10-CM

## 2014-02-08 DIAGNOSIS — I10 Essential (primary) hypertension: Secondary | ICD-10-CM | POA: Diagnosis present

## 2014-02-08 DIAGNOSIS — K2289 Other specified disease of esophagus: Secondary | ICD-10-CM | POA: Diagnosis present

## 2014-02-08 DIAGNOSIS — Z8249 Family history of ischemic heart disease and other diseases of the circulatory system: Secondary | ICD-10-CM

## 2014-02-08 DIAGNOSIS — Z9884 Bariatric surgery status: Secondary | ICD-10-CM | POA: Diagnosis not present

## 2014-02-08 DIAGNOSIS — K228 Other specified diseases of esophagus: Secondary | ICD-10-CM | POA: Diagnosis present

## 2014-02-08 DIAGNOSIS — Z820 Family history of epilepsy and other diseases of the nervous system: Secondary | ICD-10-CM

## 2014-02-08 DIAGNOSIS — I4891 Unspecified atrial fibrillation: Secondary | ICD-10-CM | POA: Diagnosis not present

## 2014-02-08 DIAGNOSIS — E7800 Pure hypercholesterolemia, unspecified: Secondary | ICD-10-CM | POA: Diagnosis present

## 2014-02-08 DIAGNOSIS — Z6841 Body Mass Index (BMI) 40.0 and over, adult: Secondary | ICD-10-CM

## 2014-02-08 DIAGNOSIS — Z981 Arthrodesis status: Secondary | ICD-10-CM

## 2014-02-08 DIAGNOSIS — R1310 Dysphagia, unspecified: Secondary | ICD-10-CM | POA: Diagnosis present

## 2014-02-08 DIAGNOSIS — M1990 Unspecified osteoarthritis, unspecified site: Secondary | ICD-10-CM | POA: Diagnosis present

## 2014-02-08 DIAGNOSIS — E785 Hyperlipidemia, unspecified: Secondary | ICD-10-CM | POA: Diagnosis present

## 2014-02-08 DIAGNOSIS — I519 Heart disease, unspecified: Secondary | ICD-10-CM | POA: Diagnosis not present

## 2014-02-08 DIAGNOSIS — I9789 Other postprocedural complications and disorders of the circulatory system, not elsewhere classified: Secondary | ICD-10-CM | POA: Diagnosis present

## 2014-02-08 LAB — HEMOGLOBIN AND HEMATOCRIT, BLOOD
HCT: 36.6 % — ABNORMAL LOW (ref 39.0–52.0)
Hemoglobin: 12.5 g/dL — ABNORMAL LOW (ref 13.0–17.0)

## 2014-02-08 LAB — BASIC METABOLIC PANEL
Anion gap: 13 (ref 5–15)
BUN: 10 mg/dL (ref 6–23)
CALCIUM: 9.2 mg/dL (ref 8.4–10.5)
CO2: 26 meq/L (ref 19–32)
CREATININE: 0.6 mg/dL (ref 0.50–1.35)
Chloride: 98 mEq/L (ref 96–112)
GFR calc Af Amer: >90 mL/min (ref >90)
Glucose, Bld: 139 mg/dL — ABNORMAL HIGH (ref 70–99)
Potassium: 3.8 mEq/L (ref 3.7–5.3)
Sodium: 137 mEq/L (ref 137–147)

## 2014-02-08 SURGERY — LAPAROSCOPIC ROUX-EN-Y GASTRIC BYPASS WITH UPPER ENDOSCOPY AND REMOVAL OF LAP BAND
Anesthesia: General | Site: Abdomen

## 2014-02-08 MED ORDER — LACTATED RINGERS IR SOLN
Status: DC | PRN
  Administered 2014-02-08: 3000 mL

## 2014-02-08 MED ORDER — PROMETHAZINE HCL 25 MG/ML IJ SOLN
6.2500 mg | INTRAMUSCULAR | Status: AC | PRN
  Administered 2014-02-08 (×2): 6.25 mg via INTRAVENOUS

## 2014-02-08 MED ORDER — OXYCODONE HCL 5 MG/5ML PO SOLN
5.0000 mg | ORAL | Status: DC | PRN
  Administered 2014-02-09: 5 mg via ORAL
  Filled 2014-02-08 (×2): qty 5

## 2014-02-08 MED ORDER — MORPHINE SULFATE 2 MG/ML IJ SOLN
2.0000 mg | INTRAMUSCULAR | Status: DC | PRN
  Administered 2014-02-08: 2 mg via INTRAVENOUS
  Administered 2014-02-08: 4 mg via INTRAVENOUS
  Administered 2014-02-09 (×2): 2 mg via INTRAVENOUS
  Administered 2014-02-09: 4 mg via INTRAVENOUS
  Administered 2014-02-09: 2 mg via INTRAVENOUS
  Filled 2014-02-08: qty 2
  Filled 2014-02-08 (×3): qty 1
  Filled 2014-02-08: qty 2
  Filled 2014-02-08: qty 1

## 2014-02-08 MED ORDER — EPHEDRINE SULFATE 50 MG/ML IJ SOLN
INTRAMUSCULAR | Status: AC
  Filled 2014-02-08: qty 1

## 2014-02-08 MED ORDER — UNJURY VANILLA POWDER
2.0000 [oz_av] | Freq: Four times a day (QID) | ORAL | Status: DC
  Filled 2014-02-08 (×4): qty 27

## 2014-02-08 MED ORDER — PROPOFOL 10 MG/ML IV BOLUS
INTRAVENOUS | Status: DC | PRN
  Administered 2014-02-08: 200 mg via INTRAVENOUS

## 2014-02-08 MED ORDER — FENTANYL CITRATE 0.05 MG/ML IJ SOLN
INTRAMUSCULAR | Status: AC
  Filled 2014-02-08: qty 5

## 2014-02-08 MED ORDER — TISSEEL VH 10 ML EX KIT
PACK | CUTANEOUS | Status: DC | PRN
Start: 2014-02-08 — End: 2014-02-08
  Administered 2014-02-08: 20 mL

## 2014-02-08 MED ORDER — ACETAMINOPHEN 160 MG/5ML PO SOLN
650.0000 mg | ORAL | Status: DC | PRN
  Administered 2014-02-09: 650 mg via ORAL
  Filled 2014-02-08: qty 20.3

## 2014-02-08 MED ORDER — GLYCOPYRROLATE 0.2 MG/ML IJ SOLN
INTRAMUSCULAR | Status: AC
  Filled 2014-02-08: qty 3

## 2014-02-08 MED ORDER — ROCURONIUM BROMIDE 100 MG/10ML IV SOLN
INTRAVENOUS | Status: DC | PRN
  Administered 2014-02-08: 20 mg via INTRAVENOUS
  Administered 2014-02-08: 10 mg via INTRAVENOUS
  Administered 2014-02-08: 60 mg via INTRAVENOUS
  Administered 2014-02-08: 10 mg via INTRAVENOUS
  Administered 2014-02-08: 5 mg via INTRAVENOUS

## 2014-02-08 MED ORDER — HYDROMORPHONE HCL PF 1 MG/ML IJ SOLN
INTRAMUSCULAR | Status: AC
  Filled 2014-02-08: qty 1

## 2014-02-08 MED ORDER — DEXTROSE 5 % IV SOLN
INTRAVENOUS | Status: AC
  Filled 2014-02-08: qty 2

## 2014-02-08 MED ORDER — ONDANSETRON HCL 4 MG/2ML IJ SOLN
INTRAMUSCULAR | Status: DC | PRN
  Administered 2014-02-08: 4 mg via INTRAVENOUS

## 2014-02-08 MED ORDER — BUPIVACAINE-EPINEPHRINE (PF) 0.25% -1:200000 IJ SOLN
INTRAMUSCULAR | Status: AC
  Filled 2014-02-08: qty 30

## 2014-02-08 MED ORDER — GLYCOPYRROLATE 0.2 MG/ML IJ SOLN
INTRAMUSCULAR | Status: AC
  Filled 2014-02-08: qty 1

## 2014-02-08 MED ORDER — DILTIAZEM LOAD VIA INFUSION
5.0000 mg | INTRAVENOUS | Status: DC | PRN
  Filled 2014-02-08: qty 5

## 2014-02-08 MED ORDER — METOPROLOL TARTRATE 1 MG/ML IV SOLN
2.5000 mg | INTRAVENOUS | Status: DC | PRN
  Administered 2014-02-08: 2.5 mg via INTRAVENOUS

## 2014-02-08 MED ORDER — HEPARIN SODIUM (PORCINE) 5000 UNIT/ML IJ SOLN
5000.0000 [IU] | Freq: Three times a day (TID) | INTRAMUSCULAR | Status: DC
  Administered 2014-02-08 – 2014-02-10 (×5): 5000 [IU] via SUBCUTANEOUS
  Filled 2014-02-08 (×8): qty 1

## 2014-02-08 MED ORDER — GLYCOPYRROLATE 0.2 MG/ML IJ SOLN
INTRAMUSCULAR | Status: DC | PRN
  Administered 2014-02-08: 0.6 mg via INTRAVENOUS
  Administered 2014-02-08: 0.2 mg via INTRAVENOUS

## 2014-02-08 MED ORDER — ACETAMINOPHEN 10 MG/ML IV SOLN
1000.0000 mg | Freq: Once | INTRAVENOUS | Status: AC
  Administered 2014-02-08: 1000 mg via INTRAVENOUS
  Filled 2014-02-08: qty 100

## 2014-02-08 MED ORDER — ROCURONIUM BROMIDE 100 MG/10ML IV SOLN
INTRAVENOUS | Status: AC
  Filled 2014-02-08: qty 1

## 2014-02-08 MED ORDER — FENTANYL CITRATE 0.05 MG/ML IJ SOLN
INTRAMUSCULAR | Status: DC | PRN
  Administered 2014-02-08 (×4): 50 ug via INTRAVENOUS
  Administered 2014-02-08: 100 ug via INTRAVENOUS
  Administered 2014-02-08: 25 ug via INTRAVENOUS
  Administered 2014-02-08 (×3): 50 ug via INTRAVENOUS
  Administered 2014-02-08: 25 ug via INTRAVENOUS

## 2014-02-08 MED ORDER — METOPROLOL TARTRATE 1 MG/ML IV SOLN
INTRAVENOUS | Status: AC
  Filled 2014-02-08: qty 5

## 2014-02-08 MED ORDER — CHLORHEXIDINE GLUCONATE 4 % EX LIQD: 60.0000 mL | Freq: Once | CUTANEOUS | Status: DC

## 2014-02-08 MED ORDER — TISSEEL VH 10 ML EX KIT
PACK | CUTANEOUS | Status: AC
  Filled 2014-02-08: qty 1

## 2014-02-08 MED ORDER — NEOSTIGMINE METHYLSULFATE 10 MG/10ML IV SOLN
INTRAVENOUS | Status: DC | PRN
  Administered 2014-02-08: 4.5 mg via INTRAVENOUS

## 2014-02-08 MED ORDER — ONDANSETRON HCL 4 MG/2ML IJ SOLN
INTRAMUSCULAR | Status: AC
  Filled 2014-02-08: qty 2

## 2014-02-08 MED ORDER — OXYCODONE HCL 5 MG PO TABS: 5.0000 mg | ORAL_TABLET | Freq: Once | ORAL | Status: DC | PRN

## 2014-02-08 MED ORDER — MIDAZOLAM HCL 2 MG/2ML IJ SOLN
INTRAMUSCULAR | Status: AC
  Filled 2014-02-08: qty 2

## 2014-02-08 MED ORDER — BUPIVACAINE-EPINEPHRINE 0.25% -1:200000 IJ SOLN
INTRAMUSCULAR | Status: DC | PRN
  Administered 2014-02-08: 30 mL

## 2014-02-08 MED ORDER — DILTIAZEM HCL 25 MG/5ML IV SOLN
5.0000 mg | Freq: Once | INTRAVENOUS | Status: AC
  Administered 2014-02-08: 5 mg via INTRAVENOUS

## 2014-02-08 MED ORDER — METOPROLOL TARTRATE 1 MG/ML IV SOLN
2.5000 mg | Freq: Once | INTRAVENOUS | Status: AC
  Administered 2014-02-08: 2.5 mg via INTRAVENOUS

## 2014-02-08 MED ORDER — CEFOTETAN DISODIUM-DEXTROSE 2-2.08 GM-% IV SOLR
INTRAVENOUS | Status: AC
Start: 2014-02-08 — End: 2014-02-08
  Filled 2014-02-08: qty 50

## 2014-02-08 MED ORDER — DEXTROSE 5 % IV SOLN
2.0000 g | INTRAVENOUS | Status: AC
  Administered 2014-02-08 (×2): 2 g via INTRAVENOUS

## 2014-02-08 MED ORDER — UNJURY CHOCOLATE CLASSIC POWDER
2.0000 [oz_av] | Freq: Four times a day (QID) | ORAL | Status: DC
  Administered 2014-02-10: 2 [oz_av] via ORAL
  Filled 2014-02-08 (×4): qty 27

## 2014-02-08 MED ORDER — METOPROLOL TARTRATE 1 MG/ML IV SOLN: 5.0000 mg | Freq: Four times a day (QID) | INTRAVENOUS | Status: DC

## 2014-02-08 MED ORDER — SODIUM CHLORIDE 0.9 % IJ SOLN
INTRAMUSCULAR | Status: AC
  Filled 2014-02-08: qty 10

## 2014-02-08 MED ORDER — NEOSTIGMINE METHYLSULFATE 10 MG/10ML IV SOLN
INTRAVENOUS | Status: AC
  Filled 2014-02-08: qty 1

## 2014-02-08 MED ORDER — MIDAZOLAM HCL 5 MG/5ML IJ SOLN
INTRAMUSCULAR | Status: DC | PRN
  Administered 2014-02-08: 2 mg via INTRAVENOUS

## 2014-02-08 MED ORDER — PROPOFOL 10 MG/ML IV BOLUS
INTRAVENOUS | Status: AC
  Filled 2014-02-08: qty 20

## 2014-02-08 MED ORDER — LIDOCAINE HCL (CARDIAC) 20 MG/ML IV SOLN
INTRAVENOUS | Status: DC | PRN
  Administered 2014-02-08: 60 mg via INTRAVENOUS

## 2014-02-08 MED ORDER — ACETAMINOPHEN 160 MG/5ML PO SOLN: 325.0000 mg | ORAL | Status: DC | PRN

## 2014-02-08 MED ORDER — HYDROMORPHONE HCL PF 1 MG/ML IJ SOLN
0.2500 mg | INTRAMUSCULAR | Status: DC | PRN
  Administered 2014-02-08 (×4): 0.5 mg via INTRAVENOUS

## 2014-02-08 MED ORDER — POTASSIUM CHLORIDE IN NACL 20-0.9 MEQ/L-% IV SOLN
INTRAVENOUS | Status: DC
  Administered 2014-02-08 – 2014-02-10 (×5): via INTRAVENOUS
  Filled 2014-02-08 (×7): qty 1000

## 2014-02-08 MED ORDER — CETYLPYRIDINIUM CHLORIDE 0.05 % MT LIQD
7.0000 mL | Freq: Two times a day (BID) | OROMUCOSAL | Status: DC
  Administered 2014-02-09 – 2014-02-10 (×4): 7 mL via OROMUCOSAL

## 2014-02-08 MED ORDER — DILTIAZEM HCL 100 MG IV SOLR
5.0000 mg/h | INTRAVENOUS | Status: DC
  Administered 2014-02-08: 5 mg/h via INTRAVENOUS
  Filled 2014-02-08: qty 100

## 2014-02-08 MED ORDER — ONDANSETRON HCL 4 MG/2ML IJ SOLN: 4.0000 mg | INTRAMUSCULAR | Status: DC | PRN

## 2014-02-08 MED ORDER — MEPERIDINE HCL 50 MG/ML IJ SOLN: 6.2500 mg | INTRAMUSCULAR | Status: DC | PRN

## 2014-02-08 MED ORDER — OXYCODONE HCL 5 MG/5ML PO SOLN: 5.0000 mg | Freq: Once | ORAL | Status: DC | PRN

## 2014-02-08 MED ORDER — HEPARIN SODIUM (PORCINE) 5000 UNIT/ML IJ SOLN
5000.0000 [IU] | INTRAMUSCULAR | Status: AC
  Administered 2014-02-08: 5000 [IU] via SUBCUTANEOUS
  Filled 2014-02-08: qty 1

## 2014-02-08 MED ORDER — LACTATED RINGERS IV SOLN
INTRAVENOUS | Status: DC | PRN
  Administered 2014-02-08 (×3): via INTRAVENOUS

## 2014-02-08 MED ORDER — DILTIAZEM HCL 25 MG/5ML IV SOLN
INTRAVENOUS | Status: AC
  Filled 2014-02-08: qty 5

## 2014-02-08 MED ORDER — PROMETHAZINE HCL 25 MG/ML IJ SOLN
INTRAMUSCULAR | Status: AC
  Filled 2014-02-08: qty 1

## 2014-02-08 MED ORDER — METOPROLOL TARTRATE 1 MG/ML IV SOLN
10.0000 mg | Freq: Four times a day (QID) | INTRAVENOUS | Status: DC
  Administered 2014-02-08: 5 mg via INTRAVENOUS
  Administered 2014-02-09: 10 mg via INTRAVENOUS
  Filled 2014-02-08 (×2): qty 10

## 2014-02-08 MED ORDER — UNJURY CHICKEN SOUP POWDER
2.0000 [oz_av] | Freq: Four times a day (QID) | ORAL | Status: DC
  Administered 2014-02-10: 2 [oz_av] via ORAL
  Filled 2014-02-08 (×4): qty 27

## 2014-02-08 SURGICAL SUPPLY — 74 items
APPLICATOR COTTON TIP 6IN STRL (MISCELLANEOUS) ×4 IMPLANT
APPLIER CLIP ROT 13.4 12 LRG (CLIP)
BLADE SURG SZ11 CARB STEEL (BLADE) ×2 IMPLANT
CABLE HIGH FREQUENCY MONO STRZ (ELECTRODE) ×2 IMPLANT
CANISTER SUCTION 2500CC (MISCELLANEOUS) ×2 IMPLANT
CHLORAPREP W/TINT 26ML (MISCELLANEOUS) ×2 IMPLANT
CLIP APPLIE ROT 13.4 12 LRG (CLIP) IMPLANT
CLIP SUT LAPRA TY ABSORB (SUTURE) ×4 IMPLANT
DECANTER SPIKE VIAL GLASS SM (MISCELLANEOUS) ×2 IMPLANT
DERMABOND ADVANCED (GAUZE/BANDAGES/DRESSINGS) ×1
DERMABOND ADVANCED .7 DNX12 (GAUZE/BANDAGES/DRESSINGS) ×1 IMPLANT
DEVICE SUT QUICK LOAD TK 5 (STAPLE) ×2 IMPLANT
DEVICE SUT TI-KNOT TK 5X26 (MISCELLANEOUS) ×2 IMPLANT
DEVICE SUTURE ENDOST 10MM (ENDOMECHANICALS) IMPLANT
DISSECTOR BLUNT TIP ENDO 5MM (MISCELLANEOUS) IMPLANT
DRAIN PENROSE 18X1/4 LTX STRL (WOUND CARE) ×2 IMPLANT
DRAPE CAMERA CLOSED 9X96 (DRAPES) ×2 IMPLANT
ELECT REM PT RETURN 9FT ADLT (ELECTROSURGICAL) ×2
ELECTRODE REM PT RTRN 9FT ADLT (ELECTROSURGICAL) ×1 IMPLANT
GAUZE SPONGE 4X4 12PLY STRL (GAUZE/BANDAGES/DRESSINGS) ×2 IMPLANT
GAUZE SPONGE 4X4 16PLY XRAY LF (GAUZE/BANDAGES/DRESSINGS) ×2 IMPLANT
GLOVE BIOGEL PI IND STRL 7.5 (GLOVE) ×1 IMPLANT
GLOVE BIOGEL PI INDICATOR 7.5 (GLOVE) ×1
GLOVE SS BIOGEL STRL SZ 7.5 (GLOVE) ×1 IMPLANT
GLOVE SUPERSENSE BIOGEL SZ 7.5 (GLOVE) ×1
GOWN STRL REUS W/TWL XL LVL3 (GOWN DISPOSABLE) ×4 IMPLANT
HEMOSTAT SURGICEL 4X8 (HEMOSTASIS) IMPLANT
HOVERMATT SINGLE USE (MISCELLANEOUS) ×2 IMPLANT
KIT BASIN OR (CUSTOM PROCEDURE TRAY) ×2 IMPLANT
KIT GASTRIC LAVAGE 34FR ADT (SET/KITS/TRAYS/PACK) ×2 IMPLANT
NEEDLE SPNL 22GX3.5 QUINCKE BK (NEEDLE) ×2 IMPLANT
PACK CARDIOVASCULAR III (CUSTOM PROCEDURE TRAY) ×2 IMPLANT
PEN SKIN MARKING BROAD (MISCELLANEOUS) ×2 IMPLANT
POUCH SPECIMEN RETRIEVAL 10MM (ENDOMECHANICALS) IMPLANT
RELOAD 45 VASCULAR/THIN (ENDOMECHANICALS) ×2 IMPLANT
RELOAD ENDO STITCH 2.0 (ENDOMECHANICALS)
RELOAD STAPLE TA45 3.5 REG BLU (ENDOMECHANICALS) ×2 IMPLANT
RELOAD STAPLER BLUE 60MM (STAPLE) ×2 IMPLANT
RELOAD STAPLER GOLD 60MM (STAPLE) ×4 IMPLANT
RELOAD STAPLER GREEN 60MM (STAPLE) ×1 IMPLANT
RELOAD STAPLER WHITE 60MM (STAPLE) ×1 IMPLANT
SCISSORS LAP 5X35 DISP (ENDOMECHANICALS) ×2 IMPLANT
SEALANT SURGICAL APPL DUAL CAN (MISCELLANEOUS) ×2 IMPLANT
SET IRRIG TUBING LAPAROSCOPIC (IRRIGATION / IRRIGATOR) ×2 IMPLANT
SHEARS CURVED HARMONIC AC 45CM (MISCELLANEOUS) ×2 IMPLANT
SLEEVE ADV FIXATION 12X100MM (TROCAR) ×4 IMPLANT
SOLUTION ANTI FOG 6CC (MISCELLANEOUS) ×2 IMPLANT
STAPLE ECHEON FLEX 60 POW ENDO (STAPLE) ×2 IMPLANT
STAPLER RELOAD BLUE 60MM (STAPLE) ×4
STAPLER RELOAD GOLD 60MM (STAPLE) ×8
STAPLER RELOAD GREEN 60MM (STAPLE) ×2
STAPLER RELOAD WHITE 60MM (STAPLE) ×2
STAPLER VISISTAT 35W (STAPLE) ×2 IMPLANT
SUT DEVICE BRAIDED 0X39 (SUTURE) ×2 IMPLANT
SUT DVC SILK 2.0X39 (SUTURE) ×10 IMPLANT
SUT DVC VICRYL PGA 2.0X39 (SUTURE) ×10 IMPLANT
SUT MNCRL AB 4-0 PS2 18 (SUTURE) ×4 IMPLANT
SUT RELOAD ENDO STITCH 2 48X1 (ENDOMECHANICALS)
SUT RELOAD ENDO STITCH 2.0 (ENDOMECHANICALS)
SUT VIC AB 2-0 SH 27 (SUTURE)
SUT VIC AB 2-0 SH 27X BRD (SUTURE) IMPLANT
SUTURE RELOAD END STTCH 2 48X1 (ENDOMECHANICALS) IMPLANT
SUTURE RELOAD ENDO STITCH 2.0 (ENDOMECHANICALS) IMPLANT
SYR 20CC LL (SYRINGE) ×4 IMPLANT
SYR 50ML LL SCALE MARK (SYRINGE) ×2 IMPLANT
TOWEL OR 17X26 10 PK STRL BLUE (TOWEL DISPOSABLE) ×2 IMPLANT
TOWEL OR NON WOVEN STRL DISP B (DISPOSABLE) ×2 IMPLANT
TRAY FOLEY CATH 14FRSI W/METER (CATHETERS) ×2 IMPLANT
TROCAR ADV FIXATION 12X100MM (TROCAR) ×2 IMPLANT
TROCAR ADV FIXATION 5X100MM (TROCAR) ×2 IMPLANT
TROCAR BLADELESS OPT 5 100 (ENDOMECHANICALS) ×2 IMPLANT
TROCAR XCEL 12X100 BLDLESS (ENDOMECHANICALS) ×2 IMPLANT
TUBING ENDO SMARTCAP PENTAX (MISCELLANEOUS) ×2 IMPLANT
TUBING FILTER THERMOFLATOR (ELECTROSURGICAL) ×2 IMPLANT

## 2014-02-08 NOTE — Transfer of Care (Signed)
Immediate Anesthesia Transfer of Care Note  Patient: REAL CONA  Procedure(s) Performed: Procedure(s): LAPAROSCOPIC ROUX-EN-Y GASTRIC BYPASS WITH UPPER ENDOSCOPY AND REMOVAL OF LAP BAND (N/A)  Patient Location: PACU  Anesthesia Type:General  Level of Consciousness: awake, alert  and oriented  Airway & Oxygen Therapy: Patient Spontanous Breathing and Patient connected to face mask oxygen  Post-op Assessment: Report given to PACU RN and Post -op Vital signs reviewed and stable  Post vital signs: Reviewed and stable  Complications: No apparent anesthesia complications

## 2014-02-08 NOTE — Progress Notes (Signed)
Utilization review completed.  

## 2014-02-08 NOTE — Progress Notes (Signed)
Dr. Anne Fu came to see patient due to new onset a-fib post operatively.  Patient was still in a-fib at that time and his HR was over 100 bpm.  He ordered  of IV Metoprolol to be given, at 1558 I was going to give the patient the medication and his HR was in the low 70's and he was in NSR. Went to Licensed conveyancer and we looked to see when the patient converted and he converted to NSR at 1527. HR was too low to administer the full . Patient's BP had been elevated so administered  for his BP.

## 2014-02-08 NOTE — Consult Note (Addendum)
Admit date: 02/08/2014 Referring Physician  Dr. Johna Sheriff Primary Physician Johny Blamer, MD Primary Cardiologist  None Reason for Consultation  Post op AFIB  HPI: 68 year old male with morbid obesity status post lap band placement in 2008 who underwent conversion to gastric bypass on 02/08/14 by Dr. Johna Sheriff. Postoperatively, developed atrial fibrillation with rapid ventricular response. He has no prior cardiovascular history. He does however take statin, amlodipine as well as angiotensin receptor blocker as outpatient.  Currently he denies any chest pain, shortness of breath. He is alert enough to answer questions. Still feeling some effects of anesthesia. He denies any prior history of atrial fibrillation.    PMH:   Past Medical History  Diagnosis Date  . Hypertension   . Obesity   . Hypercholesterolemia   . Arthritis   . Colon polyps   . Arm fracture   . Complication of anesthesia     SLOW TO WAKE UP  . GERD (gastroesophageal reflux disease)   . H/O hiatal hernia     PSH:   Past Surgical History  Procedure Laterality Date  . Laparoscopic gastric banding  11/25/07  . Lumbar fusion  2000  . Total knee arthroplasty Right 2010  . Total knee arthroplasty Left 2010  . Foot  drop surgery Left     left calf  . Maximum access (mas)posterior lumbar interbody fusion (plif) 1 level N/A 04/08/2013    Procedure: Lumbar two-three maximum access surgery posterior lumbar interbody fusion;  Surgeon: Tia Alert, MD;  Location: [facility];  Service: Neurosurgery;  Laterality: N/A;  STIM monitoring  . Back surgery      X 2   Allergies:  Review of patient's allergies indicates no known allergies. Prior to Admit Meds:   Prior to Admission medications   Medication Sig Start Date End Date Taking? Authorizing Provider  amLODipine (NORVASC) 5 MG tablet Take 5 mg by mouth every morning.    Yes Historical Provider, MD  atorvastatin (LIPITOR) 40 MG tablet Take 40 mg by mouth daily.   Yes  Historical Provider, MD  doxazosin (CARDURA) 8 MG tablet Take 8 mg by mouth every morning.    Yes Historical Provider, MD  losartan (COZAAR) 100 MG tablet Take 100 mg by mouth every morning.    Yes Historical Provider, MD  pantoprazole (PROTONIX) 40 MG tablet Take 40 mg by mouth daily.   Yes Historical Provider, MD  calcium carbonate (TUMS - DOSED IN MG ELEMENTAL CALCIUM) 500 MG chewable tablet Chew 1 tablet by mouth 4 (four) times daily as needed for indigestion or heartburn.    Historical Provider, MD  tiZANidine (ZANAFLEX) 4 MG tablet Take 4 mg by mouth every 6 (six) hours as needed for muscle spasms.    Historical Provider, MD   Fam HX:    Family History  Problem Relation Age of Onset  . Colon cancer Neg Hx   . Heart disease Father   . Lung cancer Maternal Aunt   . Alzheimer's disease Father   . Alzheimer's disease Mother   . Alzheimer's disease Other     father's siblings x 3   he denies any cardiac family history Social HX:    History   Social History  . Marital Status: Married    Spouse Name: N/A    Number of Children: 1  . Years of Education: N/A   Occupational History  . retired    Social History Main Topics  . Smoking status: Former Smoker -- 1.00 packs/day  for 10 years    Types: Cigarettes    Quit date: 03/01/1975  . Smokeless tobacco: Never Used  . Alcohol Use: Yes     Comment: 3-4 drinks a year  . Drug Use: No  . Sexual Activity: Not on file   Other Topics Concern  . Not on file   Social History Narrative  . No narrative on file     ROS: Denies any prior cardiovascular disease, no prior stroke history, no bleeding. All 11 ROS were addressed and are negative except what is stated in the HPI   Physical Exam: Blood pressure 159/93, pulse 122, temperature 98 F (36.7 C), temperature source Oral, resp. rate 22, height  (1.727 m), weight 271 lb 2.7 oz (123 kg), SpO2 94.00%.   General: Well developed, well nourished, in no acute distress Head: Eyes  PERRLA, No xanthomas.   Normal cephalic and atramatic  Lungs:   Clear bilaterally to auscultation and percussion. Normal respiratory effort. No wheezes, no rales. Heart:   Irregularly irregular, mildly tachycardic Pulses are 2+ & equal. No murmur, rubs, gallops.  No carotid bruit. No JVD.  No abdominal bruits.  Abdomen: Postop changes noted. Obese Msk:  Back normal.  Extremities:  No clubbing, cyanosis or edema.  DP +1 Neuro: Alert and oriented, non-focal, MAE x 4 GU: Deferred Rectal: Deferred Psych:  Good affect, responds appropriately, still somewhat sleepy postop      Labs: Lab Results  Component Value Date   WBC 8.2 03/31/2013   HGB 11.4* 03/31/2013   HCT 33.0* 03/31/2013   MCV 81.3 03/31/2013   PLT 233 03/31/2013      Radiology:  No results found. Personally viewed.  EKG:  02/08/14 at 1238-atrial fibrillation with heart rate of 108 beats per minute, nonspecific ST-T wave changes, poor R wave progression. A. fib is new when compared to prior EKG. Personally viewed.   ASSESSMENT/PLAN:    68 year old male with morbid obesity status post gastric bypass surgery who developed postoperative atrial fibrillation.  1. Postoperative atrial fibrillation with rapid ventricular response-likely secondary to increased adrenergic tone.  Agree with metoprolol IV which can help blunt this affect. He was previously given 5 mg x2. I will write for 10 mg IV every 6 hours. He is currently on diltiazem drip at 7.5 however his heart rate still remains in the 115 range. Pressures currently in the 150 systolic range. He should be able to tolerate. If heart rate decreases adequately with metoprolol, I would gradually decrease his diltiazem drip and discontinue. Heart rate less than 110 is reasonable. If we are still struggling, we could consider IV amiodarone to hopefully aid and conversion as well as rate control. Hopefully, he is likely to auto convert over the next 24-48 hours. No anticoagulation for now given  his recent postoperative setting and risk for bleeding.  I will check echocardiogram. I will check basic metabolic profile.  Keep potassium greater than 4.  It is also likely that he has comorbidities with his morbid obesity including obstructive sleep apnea which can potentiate atrial fibrillation. Once in the outpatient setting, if he has not had a sleep study, consider.  2. Morbid obesity-status post gastric bypass surgery.  3. Essential hypertension-medications reviewed. Currently receiving IV metoprolol and diltiazem.  4. Hyperlipidemia-restart statin when able.  We will follow along. Donato Schultz, MD  02/08/2014  2:32 PM

## 2014-02-08 NOTE — Addendum Note (Signed)
Addendum created 02/08/14 1205 by Gaylan Gerold, MD   Modules edited: Orders

## 2014-02-08 NOTE — Anesthesia Postprocedure Evaluation (Addendum)
  Anesthesia Post-op Note  Patient: Philip [name]  Procedure(s) Performed: Procedure(s) (LRB): LAPAROSCOPIC ROUX-EN-Y GASTRIC BYPASS WITH UPPER ENDOSCOPY AND REMOVAL OF LAP BAND (N/A)  Patient Location: PACU  Anesthesia Type: General  Level of Consciousness: awake and alert   Airway and Oxygen Therapy: Patient Spontanous Breathing  Post-op Pain: mild  Post-op Assessment: Post-op Vital signs reviewed, Patient's Cardiovascular Status Stable, Respiratory Function Stable, Patent Airway and No signs of Nausea or vomiting  Last Vitals:  Filed Vitals:   02/08/14 1120  BP:   Pulse:   Temp: 36.7 C  Resp: 15    Post-op Vital Signs: stable   PACU course: Pt found to be in A-fib with rvr. Started diltiazem gtt. Pt going to stepdown. Discussed with Dr. Johna Sheriff who will contact [facility] for consult.   Complications: No apparent anesthesia complications

## 2014-02-08 NOTE — H&P (Signed)
Subjective:   obesity, dysphagia, lap band status  Patient ID: Philip [name], male DOB: 1946/01/20, 68 y.o. MRN: [id number]  HPI  Patient returns to the office well-known to me status post lap band placement in June of 2009. At that time he weighed 343 pounds with comorbidities of hypertension cholesterol sleep apnea and degenerative joint disease. The patient initially did quite well losing almost 100 pounds as of about 3 years ago. However he has had some weight regain particularly in the last year. He has had some worsening dysphagia which has required fluid to be removed from his band on a couple of occasions. He continues to have dysphagia. He describes food sticking almost immediately when he swallows in his upper chest or throat. Despite this he is grazing somewhat and eating more and has gained about 30 pounds. He recently was evaluated by GI and an upper GI series was obtained which I have reviewed. This shows fairly marked esophageal dysmotility and tertiary contractions. His band is in the normal orientation and there is no evidence of slip. There is a small hiatal hernia with a small gastric pouch herniated above the diaphragm. The patient is very distressed about recent weight gain and is concerned about his long-term health with the weight gain. His joint pain was significantly better when he was down almost 100 pounds and is still better than preop band but he feels that his joint pain and mobility issues are returning as the weight comes back up.   Past Medical History  Diagnosis Date  . Hypertension   . Obesity   . Hypercholesterolemia   . Arthritis   . Colon polyps   . Arm fracture   . Complication of anesthesia     SLOW TO WAKE UP  . GERD (gastroesophageal reflux disease)   . H/O hiatal hernia    Past Surgical History  Procedure Laterality Date  . Laparoscopic gastric banding  11/25/07  . Lumbar fusion  2000  . Total knee arthroplasty Right 2010  . Total knee arthroplasty  Left 2010  . Foot  drop surgery Left     left calf  . Maximum access (mas)posterior lumbar interbody fusion (plif) 1 level N/A 04/08/2013    Procedure: Lumbar two-three maximum access surgery posterior lumbar interbody fusion;  Surgeon: Tia Alert, MD;  Location: [facility];  Service: Neurosurgery;  Laterality: N/A;  STIM monitoring  . Back surgery      X 2   Current Facility-Administered Medications  Medication Dose Route Frequency Provider Last Rate Last Dose  . cefOXitin (MEFOXIN) 2 g in dextrose 5 % 50 mL IVPB  2 g Intravenous On Call to OR Glenna Fellows, MD      . chlorhexidine (HIBICLENS) 4 % liquid 4 application  60 mL Topical Once Glenna Fellows, MD       And  . Melene Muller ON 02/09/2014] chlorhexidine (HIBICLENS) 4 % liquid 4 application  60 mL Topical Once Glenna Fellows, MD       Facility-Administered Medications Ordered in Other Encounters  Medication Dose Route Frequency Provider Last Rate Last Dose  . lactated ringers infusion    Continuous PRN Thornell Mule, CRNA         Review of Systems  General: Positive for weight gain and fatigue but no fever chills or malaise  Respiratory: No shortness of breath cough wheezing cardiac: No chest pain palpitations leg swelling  Abdominal/GI: As above  GU: Negative  Muscular skeletal: Some chronic back and  lower extremity pain  Objective:   Physical Exam  BP 166/64  Pulse 66  Temp(Src) 98.2 F (36.8 C) (Oral)  Resp 18  Ht  (1.727 m)  Wt 273 lb 2 oz (123.889 kg)  BMI 41.54 kg/m2  SpO2 98%  General: Alert, obese Caucasian male, in no distress  Skin: Warm and dry without rash or infection.  HEENT: No palpable masses or thyromegaly. Sclera nonicteric. Pupils equal round and reactive. Oropharynx clear.  Lymph nodes: No cervical, supraclavicular, or inguinal nodes palpable.  Lungs: Breath sounds clear and equal without increased work of breathing  Cardiovascular: Regular rate and rhythm without murmur. No  JVD or edema. Peripheral pulses intact.  Abdomen: Nondistended. Soft and nontender. Port site unremarkable.No masses palpable. No organomegaly. No palpable hernias.  Extremities: No edema or joint swelling or deformity. No chronic venous stasis changes.  Neurologic: Alert and fully oriented. Gait normal.  Assessment:   Status post lap band 2009 for morbid obesity. He had initially excellent weight loss and has maintained some weight loss and resolution of sleep apnea and improvement of hypertension and joint pain. He now however has a hiatal hernia involving a small gastric pouch. No evidence of slipped. He has significant esophageal motility. I had a long discussion with the patient and his wife regarding options. We could repair his hiatal hernia but concerned that with his esophageal motility he will continue to have dysphagia with his lap band in place. I think a much more definitive correction in terms of weight loss and prevention of further dysphagia would be to remove his lap band, repaired a small hiatal hernia if technically feasible and converted to gastric bypass. I discussed this procedure in detail with the patient and his wife and they of course understand that she has had a gastric bypass. We discussed the nature of the surgery and recovery and risks of anesthetic complications, bleeding, leakage and infection, blood clots and pulmonary emboli in long-term risks of nutritional deficiencies, ulceration, stenosis, internal hernias. They were given a complete consent form to review. We will have him see a nutritionist for education for gastric bypass and check for H. Pylori. Do not feel any other workup is indicated.  Plan:   Conversion of lap band to gastric bypass with possible hiatal hernia repair.   Mariella Saa MD, FACS  02/08/2014, 7:11 AM

## 2014-02-08 NOTE — Op Note (Signed)
Preoperative Diagnosis: Morbid Obesity , Esophageal dysmotility status post lap band  Postoprative Diagnosis: Morbid Obesity,  Esophageal dysmotility status post lap band  Procedure: Procedure(s): LAPAROSCOPIC ROUX-EN-Y GASTRIC BYPASS WITH UPPER ENDOSCOPY AND REMOVAL OF LAP BAND   Surgeon: Glenna Fellows T   Assistants: Ovidio Kin  Anesthesia:  General endotracheal anesthesia  Indications: Patient is a 68 year old male with a history of morbid obesity and status post lap band placement 2008. The patient initially had very good weight loss and is still about 70 pounds down from his presentation weight but has developed increasing difficulty with band adjustment with dysphagia and upper GI series has shown significant esophageal dysmotility and some degree of esophageal dilatation. He is gradually gaining weight as we are unable to adjust his band secondary to this and after extensive discussion and preoperative workup detailed elsewhere we have elected to proceed with removal of his lap band and conversion to gastric bypass.  Procedure Detail:  Patient was brought to the operating room, placed in the supine position on the operating table, and general endotracheal anesthesia induced. Foley catheter was placed. The abdomen was widely sterilely prepped and draped. He was given preoperative IV antibiotics. He had undergone a mechanical bowel prep at home. Patient time out was performed and correct procedure verified. Access was obtained with a 12 mm Optiview trocar in the left upper quadrant without difficulty and pneumoperitoneum established. Under direct vision I placed a 12 mm trocar laterally in the right upper quadrant, another 12 mm trocar in the right abdomen midclavicular line and an 11 mm trocar just above it the left adrenalitis for the camera port. A 5 mm trocar was placed in the left lateral abdomen.  Through a 5 mm subxiphoid site the [facility] retractor was placed in the left lobe of  the liver elevated with excellent exposure of the stomach and hiatus. There were no adhesions of the liver to the band. The plication was identified and sharply taken down with scissor dissection dividing sutures working from the right up to the angle of Hiss and the plication was completely taken down. The band was unbuckled and cut and removed cutting the tubing near the anterior abdominal wall. The stomach was further mobilized completely restoring normal anatomy to the upper stomach. There did appear to be a small hiatal hernia as had been indicated on upper GI series. Initially we dissected posteriorly along the right crus but because of the intense adhesions posteriorly from the band I did not feel this was a safe approach. The crura bilaterally were dissected anteriorly and completely mobilized off the esophagus and the small hiatal hernia reduced. This appeared to be minimal. I elected to perform an anterior repair. This was done with a single suture of 0 Ethibond placed anterior to the esophagus and closing down to a normal size. The Ewald tube was able to be passed easily through the EG junction into the stomach. We then created a gastric pouch. An approximately 5 cm pouch was measured which was at least a centimeter below the band cicatrix. The lesser curve was dissected at this point and dissection carried back toward the lesser sac with harmonica blunt dissection until the lesser sac was entered. An initial firing of the green the 60 mm stapler was performed at the lesser curve at right angles. Several further firings of the green load 60 mm echelon stapler were then performed working superiorly up through the dissected angle of Hiss until the pouch was completely separated. There did  not appear to be any unusual thickening of the gastric wall in this area. The staple line of the gastric remnant was oversewn with a running 2-0 silk. Following this the ligament of Treitz was identified and a 40 cm  biliopancreatic limb was measured. The small bowel was divided at this point with a single firing of the 60 mm white load stapler and the mesentery was mobilized a short distance with the harmonic scalpel. A Penrose drain was sutured to the end of the Roux limb for identification and a 100 cm Roux limb was measured. At this point a side-to-side anastomosis was created between the biliopancreatic limb near the end and the side of the Roux limb with a single firing of the white load 45 mm stapler. The staple line was intact and without bleeding. The common enterotomy was closed with running 2-0 Vicryl beginning at either end of the enterotomy and tied centrally. The anastomosis appeared widely patent without bleeding and good blood supply. The mesenteric defect was closed with a running 2-0 silk. The omentum was then divided up toward the transverse colon where the Roux mesentery would lie. The Roux limb was brought up easily in an antecolic fashion with the candycane facing to the patient's left. The gastrojejunostomy was created with an initial posterior row of running 2-0 Vicryl between the Roux limb and the gastric staple line. Enterotomies were made in the pouch and the Roux limb with the harmonic scalpel and a approximately 2 cm anastomosis was created with a single firing of the Endo GIA blue load 45 mm stapler. The staple line was intact and without bleeding. The common enterotomy was closed with running 2-0 Vicryl because the retina tied centrally. The usual tube was passed easily down through the anastomosis into the Roux limb. An anterior row of seromuscular running 2-0 Vicryl was placed. The Vonita Moss defect was then exposed and closed with a running 2-0 silk. Following this Dr. Ezzard Standing went above and performed upper endoscopy and with the outlet of the pouch clamped and under saline irrigation and with the pouch tensely distended with air there was no evidence of leak. The abdomen was urinated and inspected  for bleeding or any evidence of trocar injury and everything looked fine. The sutured staple lines of the jejunojejunostomy and gastrojejunostomy were coated with Tisseel tissue sealant. The Nathanson retractor was removed under direct vision. All CO2 was evacuated and trochars removed. I then made a small incision over the port site and dissection was carried down to the port with the cautery and using cautery and sharp dissection the stay sutures were removed and the port and the remainder of the tubing was completely removed. The subcutaneous site was closed with running 2-0 Vicryl. Skin incisions were closed with running or interrupted subcuticular 4-0 Monocryl and Dermabond. Sponge needle and instrument counts were correct.    Findings: As above  Estimated Blood Loss:  Minimal         Drains: none  Blood Given: none          Specimens: None        Complications:  * No complications entered in OR log *         Disposition: PACU - hemodynamically stable.         Condition: stable

## 2014-02-08 NOTE — Op Note (Signed)
Name:  WOODARD PERRELL MRN: [id number] Date of Surgery: 02/08/2014  Preop Diagnosis:  Morbid Obesity, S/P RYGB  Postop Diagnosis:  Morbid Obesity (Weight - 286, BMI - 43.6), S/P RYGB  Procedure:  Upper endoscopy  (Intraoperative)  Surgeon:  Ovidio Kin, M.D.  Anesthesia:  GET  Indications for procedure: Philip [name] is a 68 y.o. male whose primary care physician is Johny Blamer, MD and has completed a Roux-en-Y gastric bypass today by Dr. Johna Sheriff.  I am doing an intraoperative upper endoscopy to evaluate the gastric pouch and the gastro-jejunal anastomosis.  Operative Note: The patient is under general anesthesia.  Dr. Johna Sheriff is laparoscoping the patient while I do an upper endoscopy to evaluate the stomach pouch and gastrojejunal anastomosis.  With the patient intubated, I passed the Pentax endoscope without difficulty down the esophagus.  The esophago-gastric junction was at 42 cm.  The gastro-jejunal anastomosis was at 47 cm.  The mucosa of the stomach looked viable and the staple line was intact without bleeding.  The gastro-jejunal anastomosis looked okay.  While I insufflated the stomach pouch with air, Dr. Johna Sheriff clamped off the efferent limb of the jejunum.  He then flooded the upper abdomen with saline to put the gastric pouch and gastro-jejunal anastomosis under saline.  There was no bubbling or evidence of a leak.    The scope was then withdrawn.  The esophagus was unremarkable and the patient tolerated the endoscopy without difficulty.  Ovidio Kin, MD, [facility] Pager: [phone] Office phone:  [phone]

## 2014-02-08 NOTE — Addendum Note (Signed)
Addendum created 02/08/14 1250 by Gaylan Gerold, MD   Modules edited: Notes Section, Orders   Notes Section:  File: [id number]; File: [id number]

## 2014-02-09 ENCOUNTER — Inpatient Hospital Stay (HOSPITAL_COMMUNITY): Payer: Medicare Other

## 2014-02-09 DIAGNOSIS — I359 Nonrheumatic aortic valve disorder, unspecified: Secondary | ICD-10-CM

## 2014-02-09 LAB — CBC WITH DIFFERENTIAL/PLATELET
BASOS ABS: 0 10*3/uL (ref 0.0–0.1)
BASOS PCT: 0 % (ref 0–1)
EOS ABS: 0 10*3/uL (ref 0.0–0.7)
EOS PCT: 0 % (ref 0–5)
HEMATOCRIT: 34.4 % — AB (ref 39.0–52.0)
Hemoglobin: 11.8 g/dL — ABNORMAL LOW (ref 13.0–17.0)
Lymphocytes Relative: 12 % (ref 12–46)
Lymphs Abs: 1.5 10*3/uL (ref 0.7–4.0)
MCH: 29.1 pg (ref 26.0–34.0)
MCHC: 34.3 g/dL (ref 30.0–36.0)
MCV: 84.7 fL (ref 78.0–100.0)
MONO ABS: 1.4 10*3/uL — AB (ref 0.1–1.0)
Monocytes Relative: 11 % (ref 3–12)
Neutro Abs: 9.7 10*3/uL — ABNORMAL HIGH (ref 1.7–7.7)
Neutrophils Relative %: 77 % (ref 43–77)
PLATELETS: 236 10*3/uL (ref 150–400)
RBC: 4.06 MIL/uL — ABNORMAL LOW (ref 4.22–5.81)
RDW: 14.4 % (ref 11.5–15.5)
WBC: 12.7 10*3/uL — ABNORMAL HIGH (ref 4.0–10.5)

## 2014-02-09 LAB — BASIC METABOLIC PANEL
ANION GAP: 11 (ref 5–15)
BUN: 9 mg/dL (ref 6–23)
CALCIUM: 8.9 mg/dL (ref 8.4–10.5)
CO2: 25 mEq/L (ref 19–32)
CREATININE: 0.6 mg/dL (ref 0.50–1.35)
Chloride: 98 mEq/L (ref 96–112)
GFR calc Af Amer: >90 mL/min (ref >90)
Glucose, Bld: 108 mg/dL — ABNORMAL HIGH (ref 70–99)
Potassium: 3.6 mEq/L — ABNORMAL LOW (ref 3.7–5.3)
Sodium: 134 mEq/L — ABNORMAL LOW (ref 137–147)

## 2014-02-09 LAB — HEMOGLOBIN AND HEMATOCRIT, BLOOD
HCT: 36.8 % — ABNORMAL LOW (ref 39.0–52.0)
Hemoglobin: 12.3 g/dL — ABNORMAL LOW (ref 13.0–17.0)

## 2014-02-09 MED ORDER — IOHEXOL 300 MG/ML  SOLN
50.0000 mL | Freq: Once | INTRAMUSCULAR | Status: AC | PRN
  Administered 2014-02-09: 60 mL via ORAL

## 2014-02-09 MED ORDER — HYDRALAZINE HCL 20 MG/ML IJ SOLN
10.0000 mg | Freq: Four times a day (QID) | INTRAMUSCULAR | Status: DC | PRN
  Administered 2014-02-09 (×2): 10 mg via INTRAVENOUS
  Filled 2014-02-09 (×2): qty 1

## 2014-02-09 MED ORDER — METOPROLOL TARTRATE 1 MG/ML IV SOLN
5.0000 mg | Freq: Four times a day (QID) | INTRAVENOUS | Status: DC
  Administered 2014-02-09 – 2014-02-10 (×4): 5 mg via INTRAVENOUS
  Filled 2014-02-09 (×8): qty 5

## 2014-02-09 NOTE — Progress Notes (Signed)
Nutrition Education Note  Received consult for diet education per DROP protocol.   Discussed 2 week post op diet with pt. Emphasized that liquids must be non carbonated, non caffeinated, and sugar free. Fluid goals discussed. Pt to follow up with outpatient bariatric RD for further diet progression after 2 weeks. Multivitamins and minerals also reviewed. Teach back method used, pt expressed understanding, expect good compliance. Denies any nausea and said he has only minimal pain. Had not yet purchased vitamin B12 but plans to buy it.    Diet: First 2 Weeks  You will see the nutritionist about two (2) weeks after your surgery. The nutritionist will increase the types of foods you can eat if you are handling liquids well:  If you have severe vomiting or nausea and cannot handle clear liquids lasting longer than 1 day, call your surgeon  Protein Shake  Drink at least 2 ounces of shake 5-6 times per day  Each serving of protein shakes (usually 8 - 12 ounces) should have a minimum of:  15 grams of protein  And no more than 5 grams of carbohydrate  Goal for protein each day:  Men = 80 grams per day  Women = 60 grams per day  Protein powder may be added to fluids such as non-fat milk or Lactaid milk or Soy milk (limit to 35 grams added protein powder per serving)   Hydration  Slowly increase the amount of water and other clear liquids as tolerated (See Acceptable Fluids)  Slowly increase the amount of protein shake as tolerated  Sip fluids slowly and throughout the day  May use sugar substitutes in small amounts (no more than 6 - 8 packets per day; i.e. Splenda)   Fluid Goal  The first goal is to drink at least 8 ounces of protein shake/drink per day (or as directed by the nutritionist); some examples of protein shakes are [employer], [employer], EAS Edge HP, and Unjury. See handout from pre-op Bariatric Education Class:  Slowly increase the amount of protein shake you drink as  tolerated  You may find it easier to slowly sip shakes throughout the day  It is important to get your proteins in first  Your fluid goal is to drink 64 - 100 ounces of fluid daily  It may take a few weeks to build up to this  32 oz (or more) should be clear liquids  And  32 oz (or more) should be full liquids (see below for examples)  Liquids should not contain sugar, caffeine, or carbonation   Clear Liquids:  Water or Sugar-free flavored water (i.e. Fruit H2O, Propel)  Decaffeinated coffee or tea (sugar-free)  Crystal Lite, Wyler's Lite, Minute Maid Lite  Sugar-free Jell-O  Bouillon or broth  Sugar-free Popsicle: *Less than 20 calories each; Limit 1 per day   Full Liquids:  Protein Shakes/Drinks + 2 choices per day of other full liquids  Full liquids must be:  No More Than 12 grams of Carbs per serving  No More Than 3 grams of Fat per serving  Strained low-fat cream soup  Non-Fat milk  Fat-free Lactaid Milk  Sugar-free yogurt (Dannon Lite & Fit, Greek yogurt)     [place] MS, RD, [place] [phone] Pager [phone] Weekend/After Hours Pager

## 2014-02-09 NOTE — Progress Notes (Signed)
Patient alert and oriented, Post op day 1.  Provided support and encouragement.  Encouraged pulmonary toilet, ambulation and small sips of liquids.  All questions answered.  Will continue to monitor. 

## 2014-02-09 NOTE — Progress Notes (Signed)
Patient Name: Philip [name] Date of Encounter: 02/09/2014     Active Problems:   Morbid obesity   Postoperative atrial fibrillation   Hyperlipidemia   Essential hypertension    SUBJECTIVE  No chest pain or dyspnea. Back in NSR.  CURRENT MEDS . antiseptic oral rinse  7 mL Mouth Rinse BID  . heparin subcutaneous  5,000 Units Subcutaneous 3 times per day  . metoprolol  10 mg Intravenous 4 times per day  . [START ON 02/10/2014] protein supplement  2 oz Oral QID   Or  . [START ON 02/10/2014] protein supplement  2 oz Oral QID   Or  . [START ON 02/10/2014] protein supplement  2 oz Oral QID    OBJECTIVE  Filed Vitals:   02/09/14 0400 02/09/14 0600 02/09/14 0626 02/09/14 0700  BP: 181/52 205/65 168/43 125/39  Pulse: 69 68 94 68  Temp: 98.7 F (37.1 C)     TempSrc: Oral     Resp: Height:      Weight: 274 lb 0.5 oz (124.3 kg)     SpO2: 99% 96% 97% 91%    Intake/Output Summary (Last 24 hours) at 02/09/14 0724 Last data filed at 02/09/14 0600  Gross per 24 hour  Intake 4287.25 ml  Output   3725 ml  Net 562.25 ml   Filed Weights   02/08/14 0514 02/08/14 1343 02/09/14 0400  Weight: 273 lb 2 oz (123.889 kg) 271 lb 2.7 oz (123 kg) 274 lb 0.5 oz (124.3 kg)    PHYSICAL EXAM  General: Pleasant, NAD. Neuro: Alert and oriented X 3. Moves all extremities spontaneously. Psych: Normal affect. HEENT:  Normal  Neck: Supple without bruits or JVD. Lungs:  Resp regular and unlabored, CTA. Heart: RRR no s3, s4,.  There is a grade 2/6 systolic murmur at base. Abdomen: Soft, non-tender, non-distended, BS + x 4.  Extremities: No clubbing, cyanosis or edema. DP/PT/Radials 2+ and equal bilaterally.  Accessory Clinical Findings  CBC  Recent Labs  02/08/14 1820 02/09/14 0345  WBC  --  12.7*  NEUTROABS  --  9.7*  HGB 12.5* 11.8*  HCT 36.6* 34.4*  MCV  --  84.7  PLT  --  236   Basic Metabolic Panel  Recent Labs  02/08/14 1532 02/09/14 0345  NA 137 134*    K 3.8 3.6*  CL 98 98  CO2 26 25  GLUCOSE 139* 108*  BUN 10 9  CREATININE 0.60 0.60  CALCIUM 9.2 8.9   Liver Function Tests No results found for this basename: AST, ALT, ALKPHOS, BILITOT, PROT, ALBUMIN,  in the last 72 hours No results found for this basename: LIPASE, AMYLASE,  in the last 72 hours Cardiac Enzymes No results found for this basename: CKTOTAL, CKMB, CKMBINDEX, TROPONINI,  in the last 72 hours BNP No components found with this basename: POCBNP,  D-Dimer No results found for this basename: DDIMER,  in the last 72 hours Hemoglobin A1C No results found for this basename: HGBA1C,  in the last 72 hours Fasting Lipid Panel No results found for this basename: CHOL, HDL, LDLCALC, TRIG, CHOLHDL, LDLDIRECT,  in the last 72 hours Thyroid Function Tests No results found for this basename: TSH, T4TOTAL, FREET3, T3FREE, THYROIDAB,  in the last 72 hours  TELE  NSR  ECG    Radiology/Studies  No results found.  ASSESSMENT AND PLAN 1. Post-op atrial fibrillation, now back in NSR 2. Morbid obesity 3. Essential hypertension. 4. Possible sleep  apnea. 5. Hyperlipidemia.  Plan: Continue IV metoprolol until taking po reliably.Will reduce dose to 5 mg IV q6h. 2D echo today. Signed, Cassell Clement MD

## 2014-02-09 NOTE — Progress Notes (Signed)
Patient ID: Philip [name], male   DOB: 06-11-1945, 68 y.o.   MRN: [id number] 1 Day Post-Op  Subjective: No C/O this AM.  Mild abd pain controlled with meds.  Denies nausea.  In stepdown due to post op a fib but back in NSR this AM  Objective: Vital signs in last 24 hours: Temp:  [98 F (36.7 C)-98.7 F (37.1 C)] 98.7 F (37.1 C) (09/15 0400) Pulse Rate:  [61-122] 68 (09/15 0700) Resp:  [7-38] 11 (09/15 0700) BP: (125-209)/(39-94) 125/39 mmHg (09/15 0700) SpO2:  [91 %-100 %] 91 % (09/15 0700) Weight:  [271 lb 2.7 oz (123 kg)-274 lb 0.5 oz (124.3 kg)] 274 lb 0.5 oz (124.3 kg) (09/15 0400)    Intake/Output from previous day: 09/14 0701 - 09/15 0700 In: 4287.3 [I.V.:4187.3; IV Piggyback:100] Out: 3725 [Urine:3675; Blood:50] Intake/Output this shift:    General appearance: alert, cooperative and no distress Resp: clear to auscultation bilaterally Cardio: regular rate and rhythm GI: normal findings: soft, non-tender Incision/Wound: Clean and dry  Lab Results:   Recent Labs  02/08/14 1820 02/09/14 0345  WBC  --  12.7*  HGB 12.5* 11.8*  HCT 36.6* 34.4*  PLT  --  236   BMET  Recent Labs  02/08/14 1532 02/09/14 0345  NA 137 134*  K 3.8 3.6*  CL 98 98  CO2 26 25  GLUCOSE 139* 108*  BUN 10 9  CREATININE 0.60 0.60  CALCIUM 9.2 8.9     Studies/Results: No results found.  Anti-infectives: Anti-infectives   Start     Dose/Rate Route Frequency Ordered Stop   02/08/14 0517  cefOXitin (MEFOXIN) 2 g in dextrose 5 % 50 mL IVPB     2 g 100 mL/hr over 30 Minutes Intravenous On call to O.R. 02/08/14 0517 02/08/14 0927      Assessment/Plan: s/p Procedure(s): LAPAROSCOPIC ROUX-EN-Y GASTRIC BYPASS WITH UPPER ENDOSCOPY AND REMOVAL OF LAP BAND Post op a-fib, now back in NSR.  Appreciate cardiology management Otherwise doing well without apparent complication For UGI eval this AM    LOS: 1 day    Leidy Massar T 02/09/2014

## 2014-02-09 NOTE — Progress Notes (Signed)
Echo Lab  2D Echocardiogram completed.  Adriona Kaney L Kanav Kazmierczak, RDCS 02/09/2014 10:38 AM

## 2014-02-10 LAB — CBC WITH DIFFERENTIAL/PLATELET
Basophils Absolute: 0 10*3/uL (ref 0.0–0.1)
Basophils Relative: 0 % (ref 0–1)
EOS ABS: 0.1 10*3/uL (ref 0.0–0.7)
EOS PCT: 1 % (ref 0–5)
HCT: 34.2 % — ABNORMAL LOW (ref 39.0–52.0)
Hemoglobin: 11.1 g/dL — ABNORMAL LOW (ref 13.0–17.0)
LYMPHS ABS: 1.8 10*3/uL (ref 0.7–4.0)
Lymphocytes Relative: 17 % (ref 12–46)
MCH: 28.6 pg (ref 26.0–34.0)
MCHC: 32.5 g/dL (ref 30.0–36.0)
MCV: 88.1 fL (ref 78.0–100.0)
Monocytes Absolute: 1.3 10*3/uL — ABNORMAL HIGH (ref 0.1–1.0)
Monocytes Relative: 12 % (ref 3–12)
Neutro Abs: 7.4 10*3/uL (ref 1.7–7.7)
Neutrophils Relative %: 70 % (ref 43–77)
PLATELETS: 230 10*3/uL (ref 150–400)
RBC: 3.88 MIL/uL — AB (ref 4.22–5.81)
RDW: 15 % (ref 11.5–15.5)
WBC: 10.6 10*3/uL — ABNORMAL HIGH (ref 4.0–10.5)

## 2014-02-10 MED ORDER — METOPROLOL TARTRATE 25 MG PO TABS
25.0000 mg | ORAL_TABLET | Freq: Two times a day (BID) | ORAL | Status: DC
  Administered 2014-02-10: 25 mg via ORAL
  Filled 2014-02-10 (×2): qty 1

## 2014-02-10 MED ORDER — AMLODIPINE BESYLATE 5 MG PO TABS
5.0000 mg | ORAL_TABLET | Freq: Every day | ORAL | Status: DC
  Administered 2014-02-10: 5 mg via ORAL
  Filled 2014-02-10: qty 1

## 2014-02-10 MED ORDER — METOPROLOL TARTRATE 25 MG PO TABS: 25.0000 mg | ORAL_TABLET | Freq: Two times a day (BID) | ORAL | Status: DC

## 2014-02-10 NOTE — Care Management Note (Signed)
    Page 1 of 1   02/10/2014     1:30:18 PM CARE MANAGEMENT NOTE 02/10/2014  Patient:  Philip [name], Philip [name]   Account Number:  [id number]  Date Initiated:  02/10/2014  Documentation initiated by:  Lanier Clam  Subjective/Objective Assessment:   67 Y/O M ADMITTED W/MORBID OBESITY.     Action/Plan:   FROM HOME.   Anticipated DC Date:  02/10/2014   Anticipated DC Plan:  HOME/SELF CARE      DC Planning Services  CM consult      Choice offered to / List presented to:             Status of service:  Completed, signed off Medicare Important Message given?   (If response is "NO", the following Medicare IM given date fields will be blank) Date Medicare IM given:   Medicare IM given by:   Date Additional Medicare IM given:   Additional Medicare IM given by:    Discharge Disposition:  HOME/SELF CARE  Per UR Regulation:  Reviewed for med. necessity/level of care/duration of stay  If discussed at Long Length of Stay Meetings, dates discussed:    Comments:  02/10/14 Avri Paiva RN,BSN NCM [phone] D/C HOME NO NEEDS OR ORDERS.

## 2014-02-10 NOTE — Discharge Summary (Signed)
   Patient ID: JARREN PARA [id number] 67 y.o. 1945/09/20  02/08/2014  Discharge date and time: 02/10/2014   Admitting Physician: Glenna Fellows T  Discharge Physician: Glenna Fellows T  Admission Diagnoses: Morbid Obesity  Discharge Diagnoses: Same. New onset atrial fibrillation  Operations: Procedure(s): LAPAROSCOPIC ROUX-EN-Y GASTRIC BYPASS WITH UPPER ENDOSCOPY AND REMOVAL OF LAP BAND  Admission Condition: good  Discharged Condition: good  Indication for Admission: Patient is a 68 year old male with morbid obesity and significant joint pain and osteoarthritis as comorbidity. He is status post lap band placement 2008 at approximately 70 pound weight loss but has developed persistent dysphagia with esophageal dysmotility and some esophageal dilatation. After extensive preoperative workup and discussion detailed elsewhere he is electively admitted for removal of his lap band and conversion to Roux-en-Y gastric bypass  Hospital Course: On the morning of admission the patient underwent an uneventful laparoscopic removal of lap band and conversion to Roux-en-Y gastric bypass. The surgery was uneventful but in the recovery room he did develop atrial fibrillation with rapid ventricular response. His heart rate was controlled with IV Cardizem and he was observed initially in the step down unit. The patient was seen by cardiology. He converted to normal sinus rhythm on the same day and remained in normal sinus rhythm throughout the remainder of the hospitalization. He was weaned off his Cardizem and started on oral beta blocker. Echocardiogram was unremarkable, see separate report. Cardiology followup is arranged in 3 weeks. Gastrografin swallow on postop day one showed no leak or obstruction and he was started on water. He was transferred to a telemetry floor. On the second postoperative day he has minimal if any discomfort. Tolerating protein shakes well. Has had a bowel movement. He is  ambulatory. Physical exam is unremarkable. White count has returned toward normal and hemoglobin is stable. He is felt ready for discharge.  Consults: cardiology  Significant Diagnostic Studies: cardiac graphics: Echocardiogram:   Disposition: Home  Patient Instructions:    Medication List         amLODipine 5 MG tablet  Commonly known as:  NORVASC  Take 5 mg by mouth every morning.     atorvastatin 40 MG tablet  Commonly known as:  LIPITOR  Take 40 mg by mouth daily.     calcium carbonate 500 MG chewable tablet  Commonly known as:  TUMS - dosed in mg elemental calcium  Chew 1 tablet by mouth 4 (four) times daily as needed for indigestion or heartburn.     doxazosin 8 MG tablet  Commonly known as:  CARDURA  Take 8 mg by mouth every morning.     losartan 100 MG tablet  Commonly known as:  COZAAR  Take 100 mg by mouth every morning.     metoprolol tartrate 25 MG tablet  Commonly known as:  LOPRESSOR  Take 1 tablet (25 mg total) by mouth 2 (two) times daily.     pantoprazole 40 MG tablet  Commonly known as:  PROTONIX  Take 40 mg by mouth daily.     tiZANidine 4 MG tablet  Commonly known as:  ZANAFLEX  Take 4 mg by mouth every 6 (six) hours as needed for muscle spasms.        Activity: activity as tolerated Diet: bariatric protein shakes Wound Care: none needed  Follow-up:  With Dr. Johna Sheriff in 3 weeks And cardiology in 3 weeks.  Signed: Mariella Saa MD, FACS  02/10/2014, 1:02 PM

## 2014-02-10 NOTE — Progress Notes (Signed)
Patient ID: Philip [name], male   DOB: Nov 05, 1945, 68 y.o.   MRN: [id number] 2 Days Post-Op  Subjective: No complaints this morning. Tolerated water without nausea or pain. Has had 2 loose bowel movements.  Objective: Vital signs in last 24 hours: Temp:  [98 F (36.7 C)-98.9 F (37.2 C)] 98.3 F (36.8 C) (09/16 0605) Pulse Rate:  [69-74] 70 (09/16 0605) Resp:  [13-16] 16 (09/16 0605) BP: (142-180)/(46-63) 172/61 mmHg (09/16 0605) SpO2:  [95 %-97 %] 96 % (09/16 0605)    Intake/Output from previous day: 09/15 0701 - 09/16 0700 In: 1480 [P.O.:180; I.V.:1300] Out: 425 [Urine:425] Intake/Output this shift: Total I/O In: 1395 [I.V.:1395] Out: -   General appearance: alert, cooperative and no distress GI: normal findings: soft, non-tender Incision/Wound: no erythema or drainage  Lab Results:   Recent Labs  02/09/14 0345 02/09/14 1552 02/10/14 0531  WBC 12.7*  --  10.6*  HGB 11.8* 12.3* 11.1*  HCT 34.4* 36.8* 34.2*  PLT 236  --  230   BMET  Recent Labs  02/08/14 1532 02/09/14 0345  NA 137 134*  K 3.8 3.6*  CL 98 98  CO2 26 25  GLUCOSE 139* 108*  BUN 10 9  CREATININE 0.60 0.60  CALCIUM 9.2 8.9     Studies/Results: Dg Ugi W/water Sol Cm  02/09/2014   CLINICAL DATA:  Postop day 1 from gastric bypass surgery.  EXAM: WATER SOLUBLE UPPER GI SERIES  TECHNIQUE: Single-column upper GI series was performed using water soluble contrast.  CONTRAST:  60mL OMNIPAQUE IOHEXOL 300 MG/ML  SOLN  COMPARISON:  None.  FLUOROSCOPY TIME:  2 min 22 seconds  FINDINGS: Scout radiograph:  Unremarkable bowel gas pattern.  Esophagus: No evidence of esophageal mass or stricture. Esophageal dysmotility is demonstrated, with intermittent tertiary contractions. Gastroesophageal reflux of contrast also demonstrated.  Stomach: Expected postoperative appearance of gastric pouch is seen. Mildly delayed contrast emptying from the gastric pouch is demonstrated due to gastroesophageal reflux. There is  no evidence of contrast leak or extravasation.  Small Bowel: Efferent small bowel shows no evidence of dilatation or obstruction. Jejunal fold pattern is within normal limits. There is no evidence of contrast leak or extravasation.  Other:  None.  IMPRESSION: No evidence of postop leak or obstruction status post gastric bypass surgery.  Mildly delayed contrast emptying from the gastric pouch due to gastroesophageal reflux.  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   Electronically Signed   By: Myles Rosenthal M.D.   On: 02/09/2014 11:25    Anti-infectives: Anti-infectives   Start     Dose/Rate Route Frequency Ordered Stop   02/08/14 0517  cefOXitin (MEFOXIN) 2 g in dextrose 5 % 50 mL IVPB     2 g 100 mL/hr over 30 Minutes Intravenous On call to O.R. 02/08/14 0517 02/08/14 0927      Assessment/Plan: s/p Procedure(s): LAPAROSCOPIC ROUX-EN-Y GASTRIC BYPASS WITH UPPER ENDOSCOPY AND REMOVAL OF LAP BAND Doing well postoperatively. Advanced to protein shakes. Cardiology has cleared for discharge today. Plan discharge if tolerates protein shakes well.   LOS: 2 days    Mohamed Portlock T 02/10/2014

## 2014-02-10 NOTE — Progress Notes (Signed)
Patient Name: Philip [name] Date of Encounter: 02/10/2014     Active Problems:   Morbid obesity   Postoperative atrial fibrillation   Hyperlipidemia   Essential hypertension    SUBJECTIVE  Doing well .  Maintaining NSR. Echo shows good LV function. UGI shows no leak.  CURRENT MEDS . antiseptic oral rinse  7 mL Mouth Rinse BID  . heparin subcutaneous  5,000 Units Subcutaneous 3 times per day  . metoprolol  5 mg Intravenous 4 times per day  . protein supplement  2 oz Oral QID   Or  . protein supplement  2 oz Oral QID   Or  . protein supplement  2 oz Oral QID    OBJECTIVE  Filed Vitals:   02/09/14 1821 02/09/14 2207 02/10/14 0156 02/10/14 0605  BP: 142/46 167/58 179/63 172/61  Pulse: 74 70 74 70  Temp:  98.8 F (37.1 C) 98.9 F (37.2 C) 98.3 F (36.8 C)  TempSrc:  Oral Oral Oral  Resp:  Height:      Weight:      SpO2:  95% 97% 96%    Intake/Output Summary (Last 24 hours) at 02/10/14 0842 Last data filed at 02/09/14 2100  Gross per 24 hour  Intake   1280 ml  Output    425 ml  Net    855 ml   Filed Weights   02/08/14 0514 02/08/14 1343 02/09/14 0400  Weight: 273 lb 2 oz (123.889 kg) 271 lb 2.7 oz (123 kg) 274 lb 0.5 oz (124.3 kg)    PHYSICAL EXAM  General: Pleasant, NAD. Neuro: Alert and oriented X 3. Moves all extremities spontaneously. Psych: Normal affect. HEENT:  Normal  Neck: Supple without bruits or JVD. Lungs:  Resp regular and unlabored, CTA. Heart: RRR no s3, s4, or murmurs. Abdomen: Soft, non-tender, non-distended, BS + x 4.  Extremities: No clubbing, cyanosis or edema. DP/PT/Radials 2+ and equal bilaterally.  Accessory Clinical Findings  CBC  Recent Labs  02/09/14 0345 02/09/14 1552 02/10/14 0531  WBC 12.7*  --  10.6*  NEUTROABS 9.7*  --  7.4  HGB 11.8* 12.3* 11.1*  HCT 34.4* 36.8* 34.2*  MCV 84.7  --  88.1  PLT 236  --  230   Basic Metabolic Panel  Recent Labs  02/08/14 1532 02/09/14 0345  NA 137 134*    K 3.8 3.6*  CL 98 98  CO2 26 25  GLUCOSE 139* 108*  BUN 10 9  CREATININE 0.60 0.60  CALCIUM 9.2 8.9   Liver Function Tests No results found for this basename: AST, ALT, ALKPHOS, BILITOT, PROT, ALBUMIN,  in the last 72 hours No results found for this basename: LIPASE, AMYLASE,  in the last 72 hours Cardiac Enzymes No results found for this basename: CKTOTAL, CKMB, CKMBINDEX, TROPONINI,  in the last 72 hours BNP No components found with this basename: POCBNP,  D-Dimer No results found for this basename: DDIMER,  in the last 72 hours Hemoglobin A1C No results found for this basename: HGBA1C,  in the last 72 hours Fasting Lipid Panel No results found for this basename: CHOL, HDL, LDLCALC, TRIG, CHOLHDL, LDLDIRECT,  in the last 72 hours Thyroid Function Tests No results found for this basename: TSH, T4TOTAL, FREET3, T3FREE, THYROIDAB,  in the last 72 hours  TELE  NSR  ECG    Radiology/Studies  Dg Ugi W/water Sol Cm  02/09/2014   CLINICAL DATA:  Postop day 1 from gastric bypass surgery.  EXAM: WATER SOLUBLE UPPER GI SERIES  TECHNIQUE: Single-column upper GI series was performed using water soluble contrast.  CONTRAST:  60mL OMNIPAQUE IOHEXOL 300 MG/ML  SOLN  COMPARISON:  None.  FLUOROSCOPY TIME:  2 min 22 seconds  FINDINGS: Scout radiograph:  Unremarkable bowel gas pattern.  Esophagus: No evidence of esophageal mass or stricture. Esophageal dysmotility is demonstrated, with intermittent tertiary contractions. Gastroesophageal reflux of contrast also demonstrated.  Stomach: Expected postoperative appearance of gastric pouch is seen. Mildly delayed contrast emptying from the gastric pouch is demonstrated due to gastroesophageal reflux. There is no evidence of contrast leak or extravasation.  Small Bowel: Efferent small bowel shows no evidence of dilatation or obstruction. Jejunal fold pattern is within normal limits. There is no evidence of contrast leak or extravasation.  Other:  None.   IMPRESSION: No evidence of postop leak or obstruction status post gastric bypass surgery.  Mildly delayed contrast emptying from the gastric pouch due to gastroesophageal reflux.  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   Electronically Signed   By: Myles Rosenthal M.D.   On: 02/09/2014 11:25    ASSESSMENT AND PLAN 1. Post-op atrial fibrillation, now back in NSR  2. Morbid obesity  3. Essential hypertension.  4. Possible sleep apnea.  5. Hyperlipidemia.  Plan: Will switch to oral Metoprolol. Restart amlodipine. Ambulate. Okay for discharge soon from cardiology standpoint. Cardiology followup in office in several weeks.  Signed, Cassell Clement MD

## 2014-02-10 NOTE — Progress Notes (Signed)
Patient alert and oriented, pain is controlled. Patient is tolerating fluids, advanced to protein shake today patient tolerated well. Reviewed Gastric Bypass discharge instructions with patient and patient is able to articulate understanding. Provided information on BELT program, Support Group and [facility] outpatient pharmacy. All questions answered, will continue to monitor.

## 2014-02-10 NOTE — Progress Notes (Signed)
Patient alert and oriented, Post op day 2.  Provided support and encouragement.  Encouraged pulmonary toilet, ambulation and advanced to protein shake this morning.  All questions answered.  Will continue to monitor.

## 2014-02-10 NOTE — Discharge Instructions (Addendum)

## 2014-02-11 ENCOUNTER — Telehealth (HOSPITAL_COMMUNITY): Payer: Self-pay

## 2014-02-11 NOTE — Telephone Encounter (Signed)
Made discharge phone call to patient per DROP protocol. Asking the following questions.    1. Do you have someone to care for you now that you are home?  yes 2. Are you having pain now that is not relieved by your pain medication?  no 3. Are you able to drink the recommended daily amount of fluids (48 ounces minimum/day) and protein (60-80 grams/day) as prescribed by the dietitian or nutritional counselor?  Yes, no problems 4. Are you taking the vitamins and minerals as prescribed?  Yes, taking them  5. Do you have the "on call" number to contact your surgeon if you have a problem or question?  yes 6. Are your incisions free of redness, swelling or drainage? (If steri strips, address that these can fall off, shower as tolerated) yes 7. Have your bowels moved since your surgery?  If not, are you passing gas?  yes 8. Are you up and walking 3-4 times per day?  yes    1. Do you have an appointment made to see your surgeon in the next month?  Yes 03/05/14 2. Were you provided your discharge medications before your surgery or before you were discharged from the hospital and are you taking them without problem?  yes 3. Were you provided phone numbers to the clinic/surgeon's office?  yes 4. Did you watch the patient education video module in the (clinic, surgeon's office, etc.) before your surgery? yes 5. Do you have a discharge checklist that was provided to you in the hospital to reference with instructions on how to take care of yourself after surgery?  yes 6. Did you see a dietitian or nutritional counselor while you were in the hospital?  yes 7. Do you have an appointment to see a dietitian or nutritional counselor in the next month? yes

## 2014-02-22 NOTE — Addendum Note (Signed)
Addendum created 02/22/14 0820 by Gaylan Gerold, MD   Modules edited: Anesthesia Responsible Staff

## 2014-02-23 ENCOUNTER — Encounter: Payer: Medicare Other | Attending: General Surgery

## 2014-02-23 DIAGNOSIS — Z713 Dietary counseling and surveillance: Secondary | ICD-10-CM | POA: Insufficient documentation

## 2014-02-23 DIAGNOSIS — Z6841 Body Mass Index (BMI) 40.0 and over, adult: Secondary | ICD-10-CM | POA: Diagnosis not present

## 2014-02-23 NOTE — Progress Notes (Signed)
Bariatric Class:  Appt start time: 1530 end time:  1630.  2 Week Post-Operative Nutrition Class  Patient was seen on 02/23/14 for Post-Operative Nutrition education at [facility].   Surgery date: 02/08/2014 Surgery type: RYGB Start weight at [facility]: 286.5 lbs on 09/25/2013 Weight today: 264.5 lbs Weight change: 14 lbs Total weight loss: 22 lbs  TANITA  BODY COMP RESULTS  01/25/14 02/23/14   BMI (kg/m^2) 42.3 40.2   Fat Mass (lbs) 123 121.0   Fat Free Mass (lbs) 155.5 143.5   Total Body Water (lbs) 114 105.0    The following the learning objectives were met by the patient during this course:  Identifies Phase 3A (Soft, High Proteins) Dietary Goals and will begin from 2 weeks post-operatively to 2 months post-operatively  Identifies appropriate sources of fluids and proteins   States protein recommendations and appropriate sources post-operatively  Identifies the need for appropriate texture modifications, mastication, and bite sizes when consuming solids  Identifies appropriate multivitamin and calcium sources post-operatively  Describes the need for physical activity post-operatively and will follow MD recommendations  States when to call healthcare provider regarding medication questions or post-operative complications  Handouts given during class include:  Phase 3A: Soft, High Protein Diet Handout  Follow-Up Plan: Patient will follow-up at [facility] in 6 weeks for 2 month post-op nutrition visit for diet advancement per MD.

## 2014-02-23 NOTE — Patient Instructions (Signed)
Patient to follow Phase 3A-Soft, High Protein Diet and follow-up at [facility] in 6 weeks for 2 months post-op nutrition visit for diet advancement. 

## 2014-04-07 ENCOUNTER — Ambulatory Visit (INDEPENDENT_AMBULATORY_CARE_PROVIDER_SITE_OTHER): Payer: Medicare Other | Admitting: Cardiology

## 2014-04-07 ENCOUNTER — Encounter: Payer: Self-pay | Admitting: Cardiology

## 2014-04-07 VITALS — BP 150/62 | HR 52 | Ht 68.0 in | Wt 264.0 lb

## 2014-04-07 DIAGNOSIS — I10 Essential (primary) hypertension: Secondary | ICD-10-CM

## 2014-04-07 DIAGNOSIS — I9789 Other postprocedural complications and disorders of the circulatory system, not elsewhere classified: Secondary | ICD-10-CM

## 2014-04-07 DIAGNOSIS — I351 Nonrheumatic aortic (valve) insufficiency: Secondary | ICD-10-CM

## 2014-04-07 DIAGNOSIS — I4891 Unspecified atrial fibrillation: Secondary | ICD-10-CM

## 2014-04-07 MED ORDER — METOPROLOL TARTRATE 25 MG PO TABS: 25.0000 mg | ORAL_TABLET | Freq: Two times a day (BID) | ORAL | Status: DC

## 2014-04-07 NOTE — Patient Instructions (Signed)
Your physician recommends that you continue on your current medications as directed. Please refer to the Current Medication list given to you today.  Your physician wants you to follow-up in: 6 months with Dr. Skains. You will receive a reminder letter in the mail two months in advance. If you don't receive a letter, please call our office to schedule the follow-up appointment.  

## 2014-04-07 NOTE — Progress Notes (Signed)
[address] [place], [place]  [address] Phone: [phone] Fax:  [phone]  Date:  04/07/2014   ID:  Philip RaMichael A Boulay, DOB 11/09/1945, MRN [id number]  PCP:  Johny BlamerHARRIS, WILLIAM, MD   History of Present Illness: Philip [name] is a 68 y.o. male with recent hospitalization in September 2015 with postoperative atrial fibrillation after conversion to gastric bypass on 02/08/14 by Dr. Johna SheriffHoxworth. He developed rapid ventricular response. No prior cardiovascular history although he does take a statin. He shortly converted to sinus rhythm, tachycardic gram showed normal LV function. Gastric bypass was successful.  He was originally treated with IV metoprolol, switch to oral. No anticoagulation.   No palpitations. Doing well. Mild aortic regurgitation. Unfortunately, brother-in-law, H [phone] diagnosed with lung cancer that may be invading his vena cava.   Wt Readings from Last 3 Encounters:  04/07/14 264 lb (119.75 kg)  02/23/14 264 lb 8 oz (119.976 kg)  02/09/14 274 lb 0.5 oz (124.3 kg)     Past Medical History  Diagnosis Date  . Hypertension   . Obesity   . Hypercholesterolemia   . Arthritis   . Colon polyps   . Arm fracture   . Complication of anesthesia     SLOW TO WAKE UP  . GERD (gastroesophageal reflux disease)   . H/O hiatal hernia     Past Surgical History  Procedure Laterality Date  . Laparoscopic gastric banding  11/25/07  . Lumbar fusion  2000  . Total knee arthroplasty Right 2010  . Total knee arthroplasty Left 2010  . Foot  drop surgery Left     left calf  . Maximum access (mas)posterior lumbar interbody fusion (plif) 1 level N/A 04/08/2013    Procedure: Lumbar two-three maximum access surgery posterior lumbar interbody fusion;  Surgeon: Tia Alertavid S Jones, MD;  Location: [facility];  Service: Neurosurgery;  Laterality: N/A;  STIM monitoring  . Back surgery      X 2    Current Outpatient Prescriptions  Medication Sig Dispense Refill  .  amLODipine (NORVASC) 5 MG tablet Take 5 mg by mouth every morning.     Marland Kitchen. atorvastatin (LIPITOR) 40 MG tablet Take 40 mg by mouth daily.    Marland Kitchen. doxazosin (CARDURA) 8 MG tablet Take 8 mg by mouth every morning.     Marland Kitchen. losartan (COZAAR) 100 MG tablet Take 100 mg by mouth every morning.     . metoprolol tartrate (LOPRESSOR) 25 MG tablet Take 1 tablet (25 mg total) by mouth 2 (two) times daily. 60 tablet 2  . tiZANidine (ZANAFLEX) 4 MG tablet Take 4 mg by mouth every 6 (six) hours as needed for muscle spasms.     No current facility-administered medications for this visit.    Allergies:   No Known Allergies  Social History:  The patient  reports that he quit smoking about 39 years ago. His smoking use included Cigarettes. He has a 10 pack-year smoking history. He has never used smokeless tobacco. He reports that he drinks alcohol. He reports that he does not use illicit drugs.   Family History  Problem Relation Age of Onset  . Colon cancer Neg Hx   . Heart disease Father   . Lung cancer Maternal Aunt   . Alzheimer's disease Father   . Alzheimer's disease Mother   . Alzheimer's disease Other     father's siblings x 3    ROS:  Please see the history of present  illness.   Denies any palpitations, syncope, bleeding, orthopnea.   All other systems reviewed and negative.   PHYSICAL EXAM: VS:  BP 150/62 mmHg  Pulse 52  Ht 5\' 8"  (1.727 m)  Wt 264 lb (119.75 kg)  BMI 40.15 kg/m2 Well nourished, well developed, in no acute distress HEENT: normal, /AT, EOMI Neck: no JVD, normal carotid upstroke, no bruit Cardiac:  normal S1, S2; RRR; no murmur Lungs:  clear to auscultation bilaterally, no wheezing, rhonchi or rales Abd: soft, nontender, no hepatomegaly, no bruits Ext: no edema, 2+ distal pulses Skin: warm and dry GU: deferred Neuro: no focal abnormalities noted, AAO x 3  EKG:  None today. Previously A. Fib with RVR Echocardiogram 02/09/14-normal ejection fraction, mild aortic  regurgitation, LVH   ASSESSMENT AND PLAN:  1. Paroxysmal atrial fibrillation postoperatively-doing well. Maintaining sinus rhythm. Continue with metoprolol which will hopefully help ward off atrial fibrillation as well as treat his hypertension. No anticoagulation at this time. If atrial fibrillation returns, we will need to proceed with anticoagulation. Weight loss will help ward off future episodes. 2. Essential hypertension-continue to monitor. Still elevated. May need to increase antihypertensives. With heart rate of 52, I will not increase metoprolol. He is not feeling any side effects of this medication. 3. Obesity-morbid. Recent gastric bypass surgery. 4. Mild aortic regurgitation-continue to monitor clinically. 5. Left ventricular hypertrophy-treat hypertension. 6. We will see back in 6 months.  Signed, Donato SchultzMark Skains, MD [facility]  04/07/2014 8:26 AM

## 2014-04-08 ENCOUNTER — Encounter: Payer: Medicare Other | Attending: General Surgery | Admitting: Dietician

## 2014-04-08 DIAGNOSIS — Z6839 Body mass index (BMI) 39.0-39.9, adult: Secondary | ICD-10-CM | POA: Diagnosis not present

## 2014-04-08 DIAGNOSIS — Z713 Dietary counseling and surveillance: Secondary | ICD-10-CM | POA: Diagnosis not present

## 2014-04-08 NOTE — Progress Notes (Signed)
  Follow-up visit:  8 Weeks Post-Operative RYGB Surgery  Medical Nutrition Therapy:  Appt start time: 1400 end time:  1435.  Primary concerns today: Post-operative Bariatric Surgery Nutrition Management. Philip NeedleMichael returns having lost 35 lbs of fat per [place] body composition scale. His wife also had RYGB also and is very supportive. Philip [name] states he is able to tolerate recommended foods.   Surgery date: 02/08/2014 Surgery type: RYGB [facility]: 286.5 lbs on 09/25/2013 (350 lbs per patient) Weight today: 262.5 (35 lbs fat loss) Weight change: 2 lbs Total weight loss: 87.5 lbs  TANITA  BODY COMP RESULTS  01/25/14 02/23/14 04/08/14   BMI (kg/m^2) 42.3 40.2 39.9   Fat Mass (lbs) 123 121.0 86   Fat Free Mass (lbs) 155.5 143.5 176.5   Total Body Water (lbs) 114 105.0 129    Preferred Learning Style:   No preference indicated   Learning Readiness:  Ready  24-hr recall: B (AM): toast with an egg or grits (0-6g) Snk (AM): [place] yogurt (12g)  L (PM): 3-4 oz meat and vegetables (21-28g) Snk (PM): EAS protein shake (17g)  D (PM): tomato soup and cheese sandwich OR baked fish (6-21g)  Snk (PM): EAS protein shake (17g)  Fluid intake: 52 oz water with flavoring, 24 oz of milk with protein shake Estimated total protein intake: 73-100g  Medications: see list Supplementation: taking, having difficulty remembering to take them regularly  Drinking while eating: rarely Hair loss: yes Carbonated beverages: tried 1 diet soda, caused mild "queasiness" N/V/D/C: nausea with certain foods  Dumping syndrome: yes, with lemon meringue pie  Recent physical activity:  Limited due to back injury, some yardwork  Progress Towards Goal(s):  In progress.  Handouts given during visit include:  Phase 3B lean protein + non-starchy vegetables   Nutritional Diagnosis:  [place]-3.3 Overweight/obesity related to past poor dietary habits and physical inactivity as evidenced by patient w/ recent RYGB  surgery following dietary guidelines for continued weight loss.     Intervention:  Nutrition counseling provided.  Teaching Method Utilized:  Visual Auditory  Barriers to learning/adherence to lifestyle change: physical limitations  Demonstrated degree of understanding via:  Teach Back   Monitoring/Evaluation:  Dietary intake, exercise, and body weight. Follow up in 4 months for 6 month post-op visit.

## 2014-04-08 NOTE — Patient Instructions (Addendum)
Goals:  Follow Phase 3B: High Protein + Non-Starchy Vegetables  Eat 3-6 small meals/snacks, every 3-5 hrs  Increase lean protein foods to meet 80g goal  Increase fluid intake to 64oz +  Avoid drinking 15 minutes before, during and 30 minutes after eating  Aim for >30 min of physical activity daily  Limit carbs to 15 grams per meal   TANITA  BODY COMP RESULTS  01/25/14 02/23/14 04/08/14   BMI (kg/m^2) 42.3 40.2 39.9   Fat Mass (lbs) 123 121.0 86   Fat Free Mass (lbs) 155.5 143.5 176.5   Total Body Water (lbs) 114 105.0 129

## 2014-04-30 ENCOUNTER — Other Ambulatory Visit (INDEPENDENT_AMBULATORY_CARE_PROVIDER_SITE_OTHER): Payer: Self-pay

## 2014-04-30 DIAGNOSIS — K909 Intestinal malabsorption, unspecified: Secondary | ICD-10-CM

## 2014-04-30 DIAGNOSIS — Z9884 Bariatric surgery status: Secondary | ICD-10-CM

## 2014-05-08 LAB — LIPID PANEL
CHOLESTEROL: 105 mg/dL (ref 0–200)
HDL: 44 mg/dL (ref >39)
LDL Cholesterol: 48 mg/dL (ref 0–99)
TRIGLYCERIDES: 66 mg/dL (ref <150)
Total CHOL/HDL Ratio: 2.4 Ratio
VLDL: 13 mg/dL (ref 0–40)

## 2014-05-08 LAB — IRON AND TIBC
%SAT: 16 % — ABNORMAL LOW (ref 20–55)
Iron: 57 ug/dL (ref 42–165)
TIBC: 348 ug/dL (ref 215–435)
UIBC: 291 ug/dL (ref 125–400)

## 2014-05-08 LAB — COMPREHENSIVE METABOLIC PANEL
ALT: 35 U/L (ref 0–53)
AST: 39 U/L — AB (ref 0–37)
Albumin: 4.1 g/dL (ref 3.5–5.2)
Alkaline Phosphatase: 72 U/L (ref 39–117)
BUN: 15 mg/dL (ref 6–23)
CALCIUM: 9.6 mg/dL (ref 8.4–10.5)
CHLORIDE: 101 meq/L (ref 96–112)
CO2: 27 meq/L (ref 19–32)
Creat: 0.64 mg/dL (ref 0.50–1.35)
Glucose, Bld: 97 mg/dL (ref 70–99)
Potassium: 4.6 mEq/L (ref 3.5–5.3)
Sodium: 139 mEq/L (ref 135–145)
Total Bilirubin: 0.6 mg/dL (ref 0.2–1.2)
Total Protein: 6.9 g/dL (ref 6.0–8.3)

## 2014-05-08 LAB — CBC WITH DIFFERENTIAL/PLATELET
BASOS ABS: 0.1 10*3/uL (ref 0.0–0.1)
BASOS PCT: 1 % (ref 0–1)
Eosinophils Absolute: 0.2 10*3/uL (ref 0.0–0.7)
Eosinophils Relative: 3 % (ref 0–5)
HCT: 34.4 % — ABNORMAL LOW (ref 39.0–52.0)
Hemoglobin: 11.5 g/dL — ABNORMAL LOW (ref 13.0–17.0)
Lymphocytes Relative: 31 % (ref 12–46)
Lymphs Abs: 2.4 10*3/uL (ref 0.7–4.0)
MCH: 28.1 pg (ref 26.0–34.0)
MCHC: 33.4 g/dL (ref 30.0–36.0)
MCV: 84.1 fL (ref 78.0–100.0)
MPV: 9.8 fL (ref 9.4–12.4)
Monocytes Absolute: 0.7 10*3/uL (ref 0.1–1.0)
Monocytes Relative: 9 % (ref 3–12)
NEUTROS ABS: 4.4 10*3/uL (ref 1.7–7.7)
NEUTROS PCT: 56 % (ref 43–77)
PLATELETS: 254 10*3/uL (ref 150–400)
RBC: 4.09 MIL/uL — ABNORMAL LOW (ref 4.22–5.81)
RDW: 15.7 % — ABNORMAL HIGH (ref 11.5–15.5)
WBC: 7.9 10*3/uL (ref 4.0–10.5)

## 2014-05-08 LAB — VITAMIN B12: Vitamin B-12: 599 pg/mL (ref 211–911)

## 2014-05-08 LAB — FOLATE: FOLATE: 13.7 ng/mL

## 2014-05-10 LAB — VITAMIN D 1,25 DIHYDROXY
Vitamin D 1, 25 (OH)2 Total: 45 pg/mL (ref 18–72)
Vitamin D3 1, 25 (OH)2: 45 pg/mL

## 2014-05-11 ENCOUNTER — Telehealth (INDEPENDENT_AMBULATORY_CARE_PROVIDER_SITE_OTHER): Payer: Self-pay

## 2014-05-11 NOTE — Telephone Encounter (Signed)
-----   Message from Glenna FellowsBenjamin Hoxworth, MD sent at 05/10/2014  2:24 PM EST ----- Please call pt-iron level a little low and mild anemia-need one additional iron supplement daily

## 2014-05-11 NOTE — Telephone Encounter (Signed)
Called and spoke to patient [name](Linda) regarding lab results.  Made aware that patient needs to add an Iron supplement to take with his daily multi-vitamins.

## 2014-05-13 LAB — VITAMIN B1: Vitamin B1 (Thiamine): 16 nmol/L (ref 8–30)

## 2014-06-03 ENCOUNTER — Other Ambulatory Visit: Payer: Self-pay | Admitting: Surgery

## 2014-08-09 ENCOUNTER — Encounter: Payer: Medicare Other | Attending: General Surgery | Admitting: Dietician

## 2014-08-09 DIAGNOSIS — Z713 Dietary counseling and surveillance: Secondary | ICD-10-CM | POA: Diagnosis not present

## 2014-08-09 DIAGNOSIS — Z6839 Body mass index (BMI) 39.0-39.9, adult: Secondary | ICD-10-CM | POA: Insufficient documentation

## 2014-08-09 NOTE — Progress Notes (Signed)
  Follow-up visit:  6 months Post-Operative RYGB Surgery  Medical Nutrition Therapy:  Appt start time: 200 end time:  220  Primary concerns today: Post-operative Bariatric Surgery Nutrition Management. Philip [name] returns having lost 4 pounds. He states his wife has been in and out of the hospital for the past few weeks and he has been stress eating, mostly carbs.    Surgery date: 02/08/2014 Surgery type: RYGB [facility] at [facility]: 286.5 lbs on 09/25/2013 (350 lbs per patient) Weight today: 258.5 lbs Weight change: 4 lbs Total weight loss: 91.5 lbs  TANITA  BODY COMP RESULTS  01/25/14 02/23/14 04/08/14 08/09/14   BMI (kg/m^2) 42.3 40.2 39.9 39.3   Fat Mass (lbs) 123 121.0 86 102.5   Fat Free Mass (lbs) 155.5 143.5 176.5 156   Total Body Water (lbs) 114 105.0 129 114    Preferred Learning Style:   No preference indicated   Learning Readiness:  Ready  24-hr recall: Patient reports erratic eating pattern.  Fluid intake: 52 oz water with flavoring, 24 oz of milk with protein shake Estimated total protein intake: unable to determine  Medications: see list Supplementation: taking, having difficulty remembering to take them regularly  Drinking while eating: rarely Hair loss: yes Carbonated beverages: tried 1 diet soda, caused mild "queasiness" N/V/D/C: nausea with certain foods  Dumping syndrome: yes, with lemon meringue pie  Recent physical activity:  Limited due to back injury, some yardwork  Progress Towards Goal(s):  In progress.   Nutritional Diagnosis:  [place]-3.3 Overweight/obesity related to past poor dietary habits and physical inactivity as evidenced by patient w/ recent RYGB surgery following dietary guidelines for continued weight loss.     Intervention:  Nutrition counseling provided.  Teaching Method Utilized:  Visual Auditory  Barriers to learning/adherence to lifestyle change: physical limitations  Demonstrated degree of understanding via:  Teach Back    Monitoring/Evaluation:  Dietary intake, exercise, and body weight. Follow up in 4 months for 10 month post-op visit. Patient does not wish to return sooner than 4 months.

## 2014-08-09 NOTE — Patient Instructions (Signed)
Goals:  Eat 3-6 small meals/snacks, every 3-5 hrs  Pre plan meals and snacks  Keep high-protein snacks available: low fat cheese, Pure Protein or Quest bars, protein shakes, beef/jerky, chicken/tuna/egg salad  Avoid carbohydrates like crackers, potatoes, tortillas, and bread  Focus on meat and vegetables

## 2014-10-11 ENCOUNTER — Ambulatory Visit (INDEPENDENT_AMBULATORY_CARE_PROVIDER_SITE_OTHER): Payer: Medicare Other | Admitting: Cardiology

## 2014-10-11 ENCOUNTER — Other Ambulatory Visit: Payer: Self-pay | Admitting: *Deleted

## 2014-10-11 ENCOUNTER — Encounter: Payer: Self-pay | Admitting: Cardiology

## 2014-10-11 VITALS — BP 140/80 | HR 54 | Ht 68.0 in | Wt 257.0 lb

## 2014-10-11 DIAGNOSIS — I4891 Unspecified atrial fibrillation: Secondary | ICD-10-CM

## 2014-10-11 DIAGNOSIS — I9789 Other postprocedural complications and disorders of the circulatory system, not elsewhere classified: Secondary | ICD-10-CM | POA: Diagnosis not present

## 2014-10-11 DIAGNOSIS — I10 Essential (primary) hypertension: Secondary | ICD-10-CM | POA: Diagnosis not present

## 2014-10-11 DIAGNOSIS — E785 Hyperlipidemia, unspecified: Secondary | ICD-10-CM | POA: Diagnosis not present

## 2014-10-11 DIAGNOSIS — I351 Nonrheumatic aortic (valve) insufficiency: Secondary | ICD-10-CM | POA: Diagnosis not present

## 2014-10-11 NOTE — Progress Notes (Signed)
[address] [place], [place]  [address] Phone: [phone] Fax:  [phone]  Date:  10/11/2014   ID:  Philip RaMichael A Barrows, DOB 07/02/1945, MRN [id number]  PCP:  Johny BlamerHARRIS, WILLIAM, MD   History of Present Illness: Philip [name] is a 69 y.o. male with recent hospitalization in September 2015 with postoperative atrial fibrillation after conversion to gastric bypass on 02/08/14 by Dr. Johna SheriffHoxworth. He developed rapid ventricular response. No prior cardiovascular history although he does take a statin. He shortly converted to sinus rhythm, echocardiogram showed normal LV function. Gastric bypass was successful.  He was originally treated with IV metoprolol, switch to oral. No anticoagulation.   No palpitations. Doing well. Mild aortic regurgitation. Unfortunately, brother-in-law, age 70 diagnosed with lung cancer that may be invading his vena cava.  His wife has been quite sick, not absorbing proteins. Albumin is very low. Weeping skin. During this visit, he was talking with her physician and he will be taking her to the hospital.   Wt Readings from Last 3 Encounters:  10/11/14 257 lb (116.574 kg)  08/09/14 258 lb 8 oz (117.255 kg)  04/08/14 262 lb 8 oz (119.069 kg)     Past Medical History  Diagnosis Date  . Hypertension   . Obesity   . Hypercholesterolemia   . Arthritis   . Colon polyps   . Arm fracture   . Complication of anesthesia     SLOW TO WAKE UP  . GERD (gastroesophageal reflux disease)   . H/O hiatal hernia     Past Surgical History  Procedure Laterality Date  . Laparoscopic gastric banding  11/25/07  . Lumbar fusion  2000  . Total knee arthroplasty Right 2010  . Total knee arthroplasty Left 2010  . Foot  drop surgery Left     left calf  . Maximum access (mas)posterior lumbar interbody fusion (plif) 1 level N/A 04/08/2013    Procedure: Lumbar two-three maximum access surgery posterior lumbar interbody fusion;  Surgeon: Tia Alertavid S Jones, MD;   Location: [facility];  Service: Neurosurgery;  Laterality: N/A;  STIM monitoring  . Back surgery      X 2    Current Outpatient Prescriptions  Medication Sig Dispense Refill  . amLODipine (NORVASC) 5 MG tablet Take 5 mg by mouth every morning.     Marland Kitchen. doxazosin (CARDURA) 8 MG tablet Take 8 mg by mouth every morning.     Marland Kitchen. losartan (COZAAR) 100 MG tablet Take 100 mg by mouth every morning.     . metoprolol tartrate (LOPRESSOR) 25 MG tablet Take 1 tablet (25 mg total) by mouth 2 (two) times daily. 60 tablet 6  . oxyCODONE-acetaminophen (PERCOCET/ROXICET) 5-325 MG per tablet     . tiZANidine (ZANAFLEX) 4 MG tablet Take 4 mg by mouth every 6 (six) hours as needed for muscle spasms.     No current facility-administered medications for this visit.    Allergies:   No Known Allergies  Social History:  The patient  reports that he quit smoking about 39 years ago. His smoking use included Cigarettes. He has a 10 pack-year smoking history. He has never used smokeless tobacco. He reports that he drinks alcohol. He reports that he does not use illicit drugs.   Family History  Problem Relation Age of Onset  . Colon cancer Neg Hx   . Heart disease Father   . Lung cancer Maternal Aunt   . Alzheimer's disease Father   .  Alzheimer's disease Mother   . Alzheimer's disease Other     father's siblings x 3    ROS:  Please see the history of present illness.   Denies any palpitations, syncope, bleeding, orthopnea.   All other systems reviewed and negative.   PHYSICAL EXAM: VS:  BP 140/80 mmHg  Pulse 54  Ht 5\' 8"  (1.727 m)  Wt 257 lb (116.574 kg)  BMI 39.09 kg/m2 Well nourished, well developed, in no acute distress HEENT: normal, [place]/AT, EOMI Neck: no JVD, normal carotid upstroke, no bruit Cardiac:  normal S1, S2; RRR; 1/6 systolic/ diastolic murmur Lungs:  clear to auscultation bilaterally, no wheezing, rhonchi or rales Abd: soft, nontender, no hepatomegaly, no bruits Ext: no edema, 2+ distal  pulses Skin: warm and dry GU: deferred Neuro: no focal abnormalities noted, AAO x 3  EKG:  None today. Previously A. Fib with RVR Echocardiogram 02/09/14-normal ejection fraction, mild aortic regurgitation, LVH   ASSESSMENT AND PLAN:  1. Paroxysmal atrial fibrillation postoperatively-doing well. Maintaining sinus rhythm. Continue with metoprolol which will hopefully help ward off atrial fibrillation as well as treat his hypertension. No anticoagulation at this time. If atrial fibrillation returns, we will need to proceed with anticoagulation. Weight loss will help ward off future episodes. No changes made. 2. Essential hypertension-continue to monitor. Still only elevated. May need to increase antihypertensives. With heart rate of 52, I will not increase metoprolol. He is not feeling any side effects of this medication. 3. Obesity-morbid. Recent gastric bypass surgery. 4. Mild aortic regurgitation-continue to monitor clinically. 5. Left ventricular hypertrophy-treat hypertension. 6. We will see back in 1 year.  Signed, Donato SchultzMark Kemoni Quesenberry, MD [facility]  10/11/2014 11:05 AM

## 2014-10-11 NOTE — Patient Instructions (Signed)
Medication Instructions:  Your physician recommends that you continue on your current medications as directed. Please refer to the Current Medication list given to you today.  Follow-Up: Follow up in 1 year with Dr. Skains.  You will receive a letter in the mail 2 months before you are due.  Please call us when you receive this letter to schedule your follow up appointment.  Thank you for choosing [facility]!!       

## 2014-12-07 ENCOUNTER — Ambulatory Visit: Payer: Medicare Other | Admitting: Dietician

## 2015-01-04 ENCOUNTER — Ambulatory Visit
Admission: RE | Admit: 2015-01-04 | Discharge: 2015-01-04 | Disposition: A | Discharge status: Home / Self Care | Payer: Medicare Other | Source: Ambulatory Visit | Attending: Family Medicine | Admitting: Family Medicine

## 2015-01-04 ENCOUNTER — Other Ambulatory Visit: Payer: Self-pay | Admitting: Family Medicine

## 2015-01-04 DIAGNOSIS — R05 Cough: Secondary | ICD-10-CM

## 2015-01-04 DIAGNOSIS — R059 Cough, unspecified: Secondary | ICD-10-CM

## 2015-04-14 ENCOUNTER — Other Ambulatory Visit: Payer: Self-pay | Admitting: Cardiology

## 2016-02-08 IMAGING — RF DG UGI W/ GASTROGRAFIN
12 series · 12 of 12 positions shown · IV contrast (omnipaque)
Comparison: None.

FLUOROSCOPY TIME:  2 min 22 seconds

CLINICAL DATA: Postop day 1 from gastric bypass surgery.

EXAM:
WATER SOLUBLE UPPER GI SERIES
TECHNIQUE: Single-column upper GI series was performed using water soluble
contrast.
CONTRAST:  60mL OMNIPAQUE IOHEXOL 300 MG/ML  SOLN

[Series 1: run · 1 of 1 slices shown (1 of 11)]
[im 1/1]
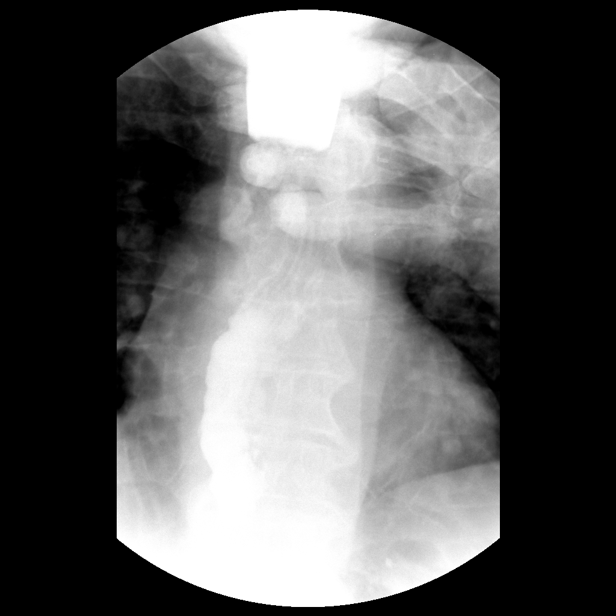

[Series 2: run · 1 of 1 slices shown (2 of 11)]
[im 1/1]
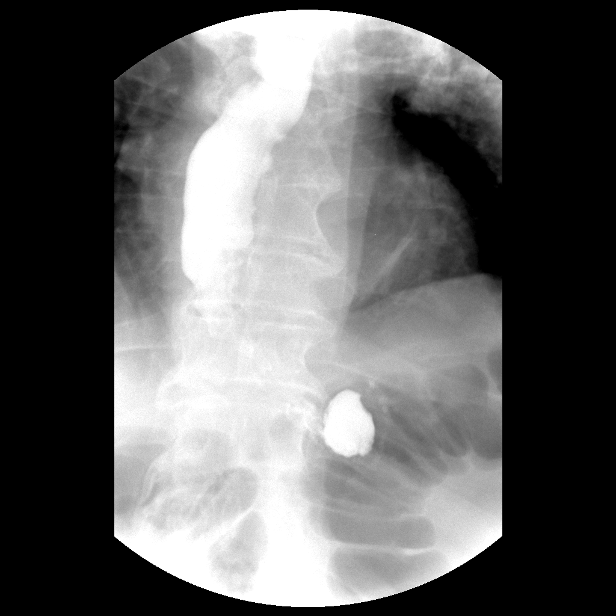

[Series 3: run · 1 of 1 slices shown (3 of 11)]
[im 1/1]
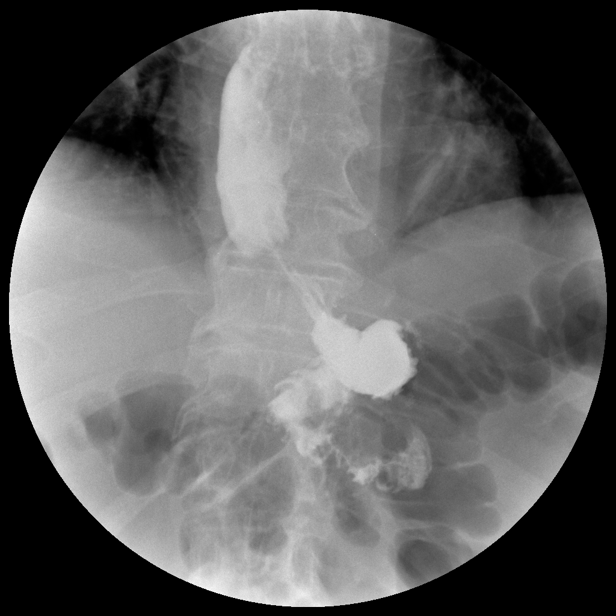

[Series 4: run · 1 of 1 slices shown (4 of 11)]
[im 1/1]
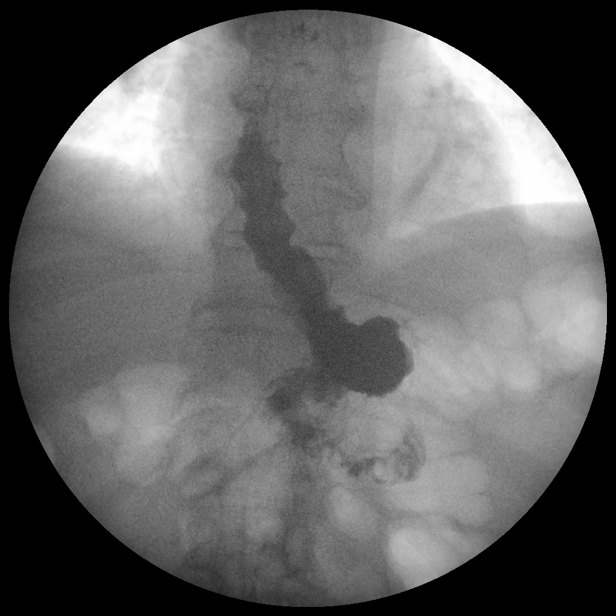

[Series 5: run · 1 of 1 slices shown (5 of 11)]
[im 1/1]
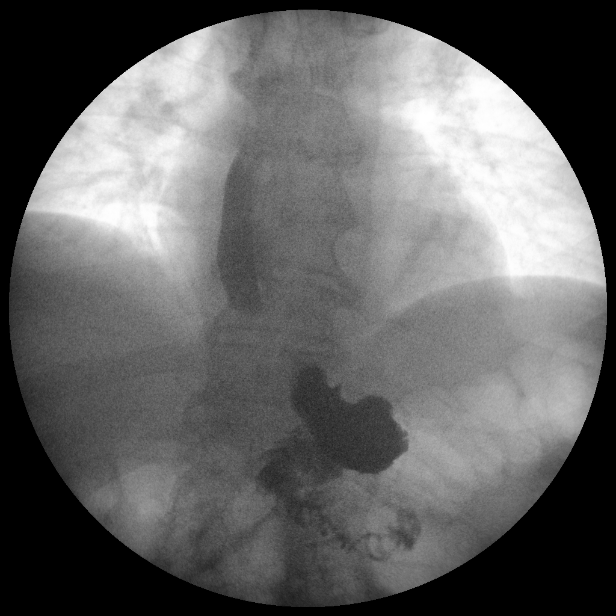

[Series 6: run · 1 of 1 slices shown (6 of 11)]
[im 1/1]
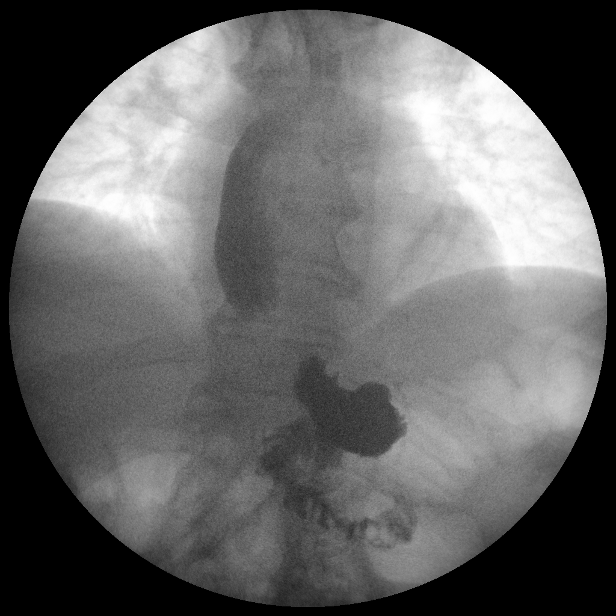

[Series 7: run · 1 of 1 slices shown (7 of 11)]
[im 1/1]
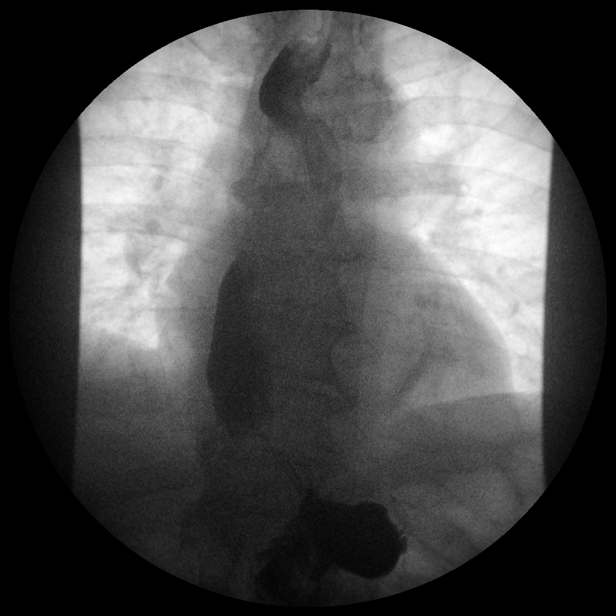

[Series 8: run · 1 of 1 slices shown (8 of 11)]
[im 1/1]
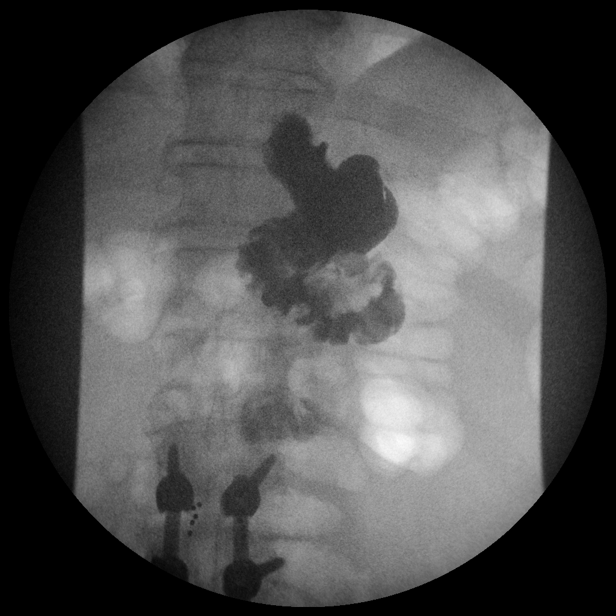

[Series 9: run · 1 of 1 slices shown (9 of 11)]
[im 1/1]
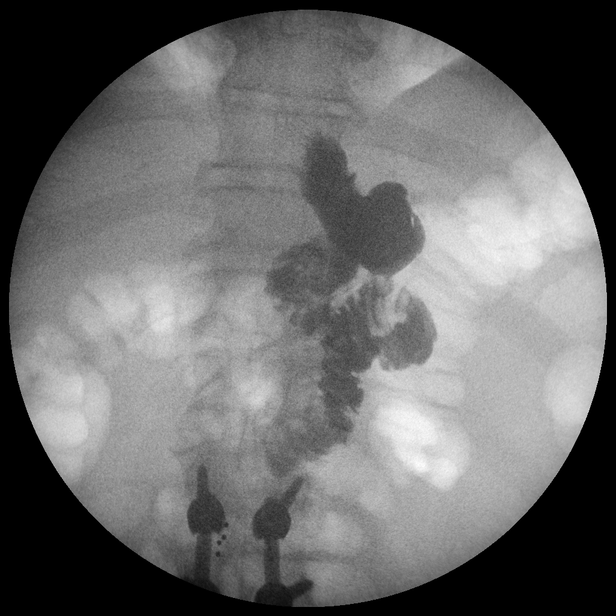

[Series 10: run · 1 of 1 slices shown (10 of 11)]
[im 1/1]
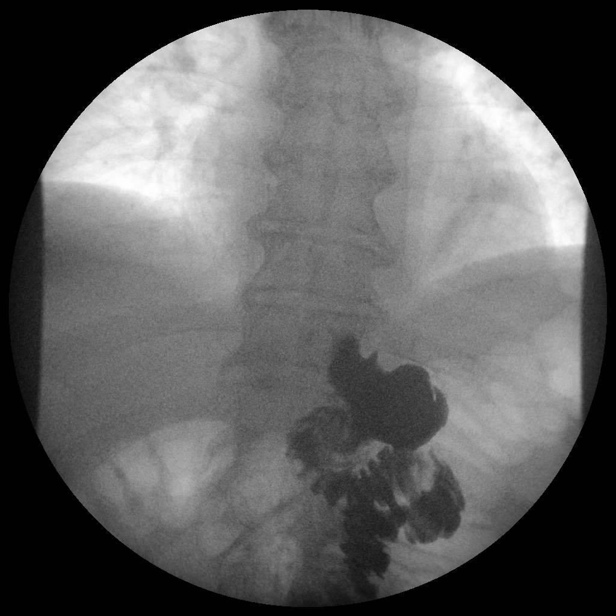

[Series 11: run · 1 of 1 slices shown (11 of 11)]
[im 1/1]
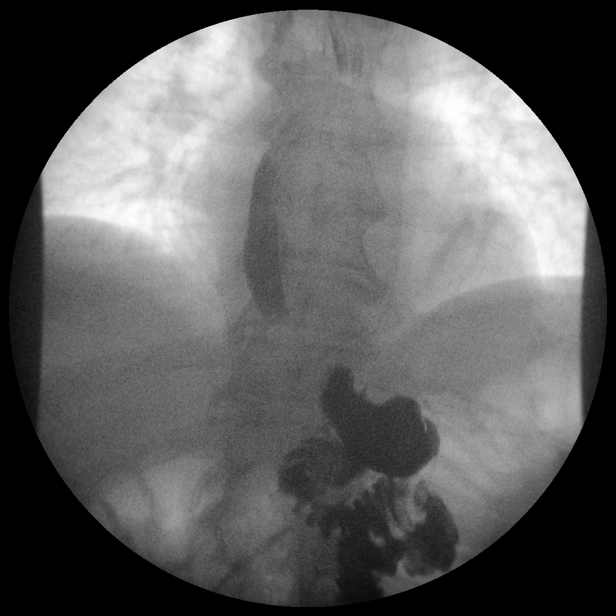

[Series 1001: view not recorded · 0.20mm/px · 1 of 1 slices shown]
[im 1/1]
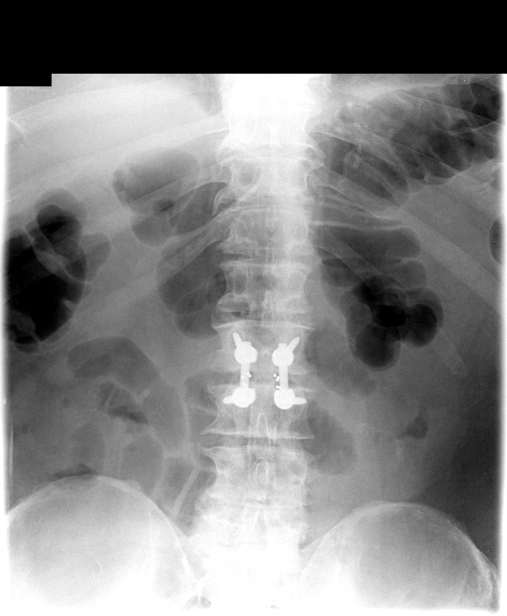

[12 of 12 positions shown; findings below may reference images not displayed]

FINDINGS: Scout radiograph:  Unremarkable bowel gas pattern.

Esophagus: No evidence of esophageal mass or stricture. Esophageal
dysmotility is demonstrated, with intermittent tertiary
contractions. Gastroesophageal reflux of contrast also demonstrated.

Stomach: Expected postoperative appearance of gastric pouch is seen.
Mildly delayed contrast emptying from the gastric pouch is
demonstrated due to gastroesophageal reflux. There is no evidence of
contrast leak or extravasation.

Small Bowel: Efferent small bowel shows no evidence of dilatation or
obstruction. Jejunal fold pattern is within normal limits. There is
no evidence of contrast leak or extravasation.

Other:  None.
IMPRESSION: No evidence of postop leak or obstruction status post gastric bypass
surgery.

Mildly delayed contrast emptying from the gastric pouch due to
gastroesophageal reflux.

Esophageal dysmotility with intermittent tertiary contractions.

## 2016-09-11 ENCOUNTER — Encounter (HOSPITAL_COMMUNITY): Payer: Self-pay

## 2016-10-04 ENCOUNTER — Telehealth (HOSPITAL_COMMUNITY): Payer: Self-pay

## 2016-10-04 NOTE — Telephone Encounter (Signed)
This patient is overdue for recommended follow-up with a [facility]. A letter was mailed to the address on file 09/11/16 from both [facility] & [facility] in attempt to reestablish post-op care. The letter included a patient [facility] which [facility] this week declining an appointment due to distance/moved out of town to [address], [place], [place] [id number].  Copy of the survey was shared with Dario GuardianFrances Jackson at [facility] so she may file in patients office chart & follow-up with the patient's request for assistance with transferring post-op bariatric care to a local provider

## 2019-05-07 ENCOUNTER — Telehealth: Payer: Self-pay

## 2019-05-07 NOTE — Telephone Encounter (Signed)
Called pt to set up virtual visit with DD 05/08/19, left message asking pt to call the office.

## 2019-07-27 DEATH — deceased

## 2021-10-10 ENCOUNTER — Encounter: Payer: Self-pay | Admitting: Internal Medicine

## 2022-05-28 DEATH — deceased
